# Patient Record
Sex: Male | Born: 1995 | Race: Black or African American | Hispanic: No | Marital: Single | Smoking: Never smoker
Health system: Southern US, Community
[De-identification: ages and names within clinical notes are randomized; demographics above are authoritative.]

## PROBLEM LIST (undated history)

## (undated) DIAGNOSIS — S92309A Fracture of unspecified metatarsal bone(s), unspecified foot, initial encounter for closed fracture: Secondary | ICD-10-CM

## (undated) DIAGNOSIS — S50312A Abrasion of left elbow, initial encounter: Secondary | ICD-10-CM

## (undated) HISTORY — PX: NO PAST SURGERIES: SHX2092

---

## 2016-09-25 ENCOUNTER — Encounter (HOSPITAL_COMMUNITY): Payer: Self-pay

## 2016-09-25 DIAGNOSIS — R1031 Right lower quadrant pain: Secondary | ICD-10-CM | POA: Diagnosis not present

## 2016-09-25 LAB — CBC
HEMATOCRIT: 40.2 % (ref 39.0–52.0)
Hemoglobin: 13.6 g/dL (ref 13.0–17.0)
MCH: 26.7 pg (ref 26.0–34.0)
MCHC: 33.8 g/dL (ref 30.0–36.0)
MCV: 78.8 fL (ref 78.0–100.0)
PLATELETS: 197 10*3/uL (ref 150–400)
RBC: 5.1 MIL/uL (ref 4.22–5.81)
RDW: 12.7 % (ref 11.5–15.5)
WBC: 6.8 10*3/uL (ref 4.0–10.5)

## 2016-09-25 LAB — URINALYSIS, ROUTINE W REFLEX MICROSCOPIC
BILIRUBIN URINE: NEGATIVE
GLUCOSE, UA: NEGATIVE mg/dL
Hgb urine dipstick: NEGATIVE
KETONES UR: NEGATIVE mg/dL
Leukocytes, UA: NEGATIVE
Nitrite: NEGATIVE
PROTEIN: NEGATIVE mg/dL
Specific Gravity, Urine: 1.025 (ref 1.005–1.030)
pH: 7 (ref 5.0–8.0)

## 2016-09-25 NOTE — ED Triage Notes (Signed)
PT C/O RUQ PAIN WITH NAUSEA SINCE THIS AFTERNOON. PT STS AFTER WRESTLING PRACTICE, THE PAIN GOT WORSE. DENIES N/V/D, OR FEVER.

## 2016-09-26 ENCOUNTER — Emergency Department (HOSPITAL_COMMUNITY)
Admission: EM | Admit: 2016-09-26 | Discharge: 2016-09-26 | Disposition: A | Discharge status: Home / Self Care | Payer: Managed Care, Other (non HMO) | Attending: Emergency Medicine | Admitting: Emergency Medicine

## 2016-09-26 ENCOUNTER — Encounter (HOSPITAL_COMMUNITY): Payer: Self-pay

## 2016-09-26 ENCOUNTER — Emergency Department (HOSPITAL_COMMUNITY): Payer: Managed Care, Other (non HMO)

## 2016-09-26 DIAGNOSIS — R1031 Right lower quadrant pain: Secondary | ICD-10-CM

## 2016-09-26 LAB — COMPREHENSIVE METABOLIC PANEL
ALT: 30 U/L (ref 17–63)
ANION GAP: 6 (ref 5–15)
AST: 32 U/L (ref 15–41)
Albumin: 4.6 g/dL (ref 3.5–5.0)
Alkaline Phosphatase: 58 U/L (ref 38–126)
BUN: 12 mg/dL (ref 6–20)
CHLORIDE: 106 mmol/L (ref 101–111)
CO2: 27 mmol/L (ref 22–32)
Calcium: 9.2 mg/dL (ref 8.9–10.3)
Creatinine, Ser: 1.03 mg/dL (ref 0.61–1.24)
GFR calc non Af Amer: >60 mL/min (ref >60)
GLUCOSE: 87 mg/dL (ref 65–99)
POTASSIUM: 4 mmol/L (ref 3.5–5.1)
SODIUM: 139 mmol/L (ref 135–145)
Total Bilirubin: 0.8 mg/dL (ref 0.3–1.2)
Total Protein: 7 g/dL (ref 6.5–8.1)

## 2016-09-26 LAB — LIPASE, BLOOD: LIPASE: 31 U/L (ref 11–51)

## 2016-09-26 MED ORDER — MORPHINE SULFATE (PF) 4 MG/ML IV SOLN
4.0000 mg | Freq: Once | INTRAVENOUS | Status: AC
  Administered 2016-09-26: 4 mg via INTRAVENOUS
  Filled 2016-09-26: qty 1

## 2016-09-26 MED ORDER — IOPAMIDOL (ISOVUE-300) INJECTION 61%
INTRAVENOUS | Status: AC
  Administered 2016-09-26: 100 mL via INTRAVENOUS
  Filled 2016-09-26: qty 100

## 2016-09-26 MED ORDER — SODIUM CHLORIDE 0.9 % IV BOLUS (SEPSIS)
1000.0000 mL | Freq: Once | INTRAVENOUS | Status: AC
  Administered 2016-09-26: 1000 mL via INTRAVENOUS

## 2016-09-26 MED ORDER — ONDANSETRON HCL 4 MG/2ML IJ SOLN
4.0000 mg | Freq: Once | INTRAMUSCULAR | Status: AC
  Administered 2016-09-26: 4 mg via INTRAVENOUS
  Filled 2016-09-26: qty 2

## 2016-09-26 MED ORDER — IOPAMIDOL (ISOVUE-300) INJECTION 61%
100.0000 mL | Freq: Once | INTRAVENOUS | Status: AC | PRN
  Administered 2016-09-26: 100 mL via INTRAVENOUS

## 2016-09-26 NOTE — Discharge Instructions (Signed)
You were seen to day for abdominal pain. Your workup is reassuring and your CT scan is negative. If you have new or worsening symptoms you need to be reevaluated.

## 2016-09-26 NOTE — ED Provider Notes (Signed)
[facility]-EMERGENCY DEPT Provider Note   CSN: [id number] Arrival date & time: 09/25/16  2249   By signing my name below, I, Nelwyn Salisbury, attest that this documentation has been prepared under the direction and in the presence of Shon Baton, MD . Electronically Signed: Nelwyn Salisbury, Scribe. 09/26/2016. 2:23 AM.  History   Chief Complaint Chief Complaint  Patient presents with  . Abdominal Pain    RUQ   The history is provided by the patient. No language interpreter was used.    HPI Comments:  Jacob English is a 21 y.o. male who presents to the Emergency Department complaining of sudden-onset, constant, worsened RLQ abdominal pain beginning earlier today. He describes his symptoms as a 7/10 pain that originally felt like a stomach ache, but has moved to his right side. Pt notes that the pain seemed to worsen after his wrestling practice today. He denies any vomiting, diarrhea, fevers or back pain.  He has not taken anything for his symptoms.  History reviewed. No pertinent past medical history.  There are no active problems to display for this patient.   History reviewed. No pertinent surgical history.     Home Medications    Prior to Admission medications   Not on File    Family History History reviewed. No pertinent family history.  Social History Social History  Substance Use Topics  . Smoking status: Never Smoker  . Smokeless tobacco: Never Used  . Alcohol use No     Allergies   Patient has no known allergies.   Review of Systems Review of Systems  Constitutional: Negative for fever.  Respiratory: Negative for shortness of breath.   Cardiovascular: Negative for chest pain.  Gastrointestinal: Positive for abdominal pain. Negative for diarrhea and vomiting.  Musculoskeletal: Negative for back pain.  All other systems reviewed and are negative.    Physical Exam Updated Vital Signs BP 123/71 (BP Location: Left Arm)   Pulse (!) 50   Temp 98.2 F  (36.8 C) (Oral)   Resp 16   Ht 5\' 5"  (1.651 m)   Wt 170 lb (77.1 kg)   SpO2 99%   BMI 28.29 kg/m   Physical Exam  Constitutional: He is oriented to person, place, and time. He appears well-developed and well-nourished.  HENT:  Head: Normocephalic and atraumatic.  Cardiovascular: Normal rate, regular rhythm and normal heart sounds.   No murmur heard. Pulmonary/Chest: Effort normal and breath sounds normal. No respiratory distress. He has no wheezes.  Abdominal: Soft. Bowel sounds are normal. There is tenderness. There is no rebound.  Right lower quadrant tenderness to palpation, positive Rovsing's  Musculoskeletal: He exhibits no edema.  Neurological: He is alert and oriented to person, place, and time.  Skin: Skin is warm and dry.  Psychiatric: He has a normal mood and affect.  Nursing note and vitals reviewed.    ED Treatments / Results  DIAGNOSTIC STUDIES:  Oxygen Saturation is 97% on RA, normal by my interpretation.    COORDINATION OF CARE:  2:44 AM Discussed treatment plan with pt at bedside which includes nausea medication, CT scan, and pain medication and pt agreed to plan.  Labs (all labs ordered are listed, but only abnormal results are displayed) Labs Reviewed  URINALYSIS, ROUTINE W REFLEX MICROSCOPIC - Abnormal; Notable for the following:       Result Value   APPearance CLOUDY (*)    All other components within normal limits  LIPASE, BLOOD  COMPREHENSIVE METABOLIC PANEL  CBC  EKG  EKG Interpretation None       Radiology Ct Abdomen Pelvis W Contrast  Result Date: 09/26/2016 CLINICAL DATA:  Acute onset of right lower quadrant abdominal pain. Initial encounter. EXAM: CT ABDOMEN AND PELVIS WITH CONTRAST TECHNIQUE: Multidetector CT imaging of the abdomen and pelvis was performed using the standard protocol following bolus administration of intravenous contrast. CONTRAST:  100 mL of Isovue 300 IV contrast COMPARISON:  None. FINDINGS: Lower chest: The  visualized lung bases are grossly clear. The visualized portions of the mediastinum are unremarkable. Hepatobiliary: The liver is unremarkable in appearance. The gallbladder is unremarkable in appearance. The common bile duct remains normal in caliber. Pancreas: The pancreas is within normal limits. Spleen: The spleen is unremarkable in appearance. Adrenals/Urinary Tract: The adrenal glands are unremarkable in appearance. The kidneys are within normal limits. There is no evidence of hydronephrosis. No renal or ureteral stones are identified. No perinephric stranding is seen. Stomach/Bowel: The stomach is unremarkable in appearance. The small bowel is within normal limits. The appendix is normal in caliber, without evidence of appendicitis. The colon is unremarkable in appearance. Vascular/Lymphatic: The abdominal aorta is unremarkable in appearance. The inferior vena cava is grossly unremarkable. No retroperitoneal lymphadenopathy is seen. No pelvic sidewall lymphadenopathy is identified. Reproductive: The bladder is mildly distended and grossly unremarkable. The prostate remains normal in size. Other: No additional soft tissue abnormalities are seen. Musculoskeletal: No acute osseous abnormalities are identified. The visualized musculature is unremarkable in appearance. IMPRESSION: Unremarkable contrast-enhanced CT of the abdomen and pelvis. Electronically Signed   By: Roanna RaiderJeffery  Chang M.D.   On: 09/26/2016 04:11    Procedures Procedures (including critical care time)  Medications Ordered in ED Medications  sodium chloride 0.9 % bolus 1,000 mL (1,000 mLs Intravenous New Bag/Given 09/26/16 0320)  morphine 4 MG/ML injection 4 mg (4 mg Intravenous Given 09/26/16 0320)  ondansetron (ZOFRAN) injection 4 mg (4 mg Intravenous Given 09/26/16 0320)  iopamidol (ISOVUE-300) 61 % injection 100 mL (100 mLs Intravenous Contrast Given 09/26/16 0347)     Initial Impression / Assessment and Plan / ED Course  I have reviewed  the triage vital signs and the nursing notes.  Pertinent labs & imaging results that were available during my care of the patient were reviewed by me and considered in my medical decision making (see chart for details).  Clinical Course     Patient presents with right mid and right lower quadrant abdominal pain. Ongoing for the last day. Initially. Umbilical but has been to the right lower quadrant. He is nontoxic. Afebrile. He is fairly tender on exam without signs of peritonitis. Vital signs reassuring. Lab work is largely reassuring. No evidence of leukocytosis. However, given clinical exam, appendicitis is certainly a consideration. Patient was given pain medication and nausea medication. CT scan obtained and largely reassuring. Patient improved with supportive measures.  After history, exam, and medical workup I feel the patient has been appropriately medically screened and is safe for discharge home. Pertinent diagnoses were discussed with the patient. Patient was given return precautions.   Final Clinical Impressions(s) / ED Diagnoses   Final diagnoses:  Right lower quadrant abdominal pain    New Prescriptions New Prescriptions   No medications on file   I personally performed the services described in this documentation, which was scribed in my presence. The recorded information has been reviewed and is accurate.     Shon Batonourtney F Jenaveve Fenstermaker, MD 09/26/16 [phone]

## 2017-04-18 DIAGNOSIS — S92309A Fracture of unspecified metatarsal bone(s), unspecified foot, initial encounter for closed fracture: Secondary | ICD-10-CM

## 2017-04-18 HISTORY — DX: Fracture of unspecified metatarsal bone(s), unspecified foot, initial encounter for closed fracture: S92.309A

## 2017-05-07 ENCOUNTER — Ambulatory Visit
Admission: RE | Admit: 2017-05-07 | Discharge: 2017-05-07 | Disposition: A | Discharge status: Home / Self Care | Payer: Managed Care, Other (non HMO) | Source: Ambulatory Visit | Attending: Sports Medicine | Admitting: Sports Medicine

## 2017-05-07 ENCOUNTER — Encounter (HOSPITAL_BASED_OUTPATIENT_CLINIC_OR_DEPARTMENT_OTHER): Payer: Self-pay | Admitting: *Deleted

## 2017-05-07 ENCOUNTER — Other Ambulatory Visit: Payer: Self-pay | Admitting: Sports Medicine

## 2017-05-07 DIAGNOSIS — M79672 Pain in left foot: Secondary | ICD-10-CM

## 2017-05-07 DIAGNOSIS — S50312A Abrasion of left elbow, initial encounter: Secondary | ICD-10-CM

## 2017-05-07 HISTORY — DX: Abrasion of left elbow, initial encounter: S50.312A

## 2017-05-08 ENCOUNTER — Encounter (HOSPITAL_BASED_OUTPATIENT_CLINIC_OR_DEPARTMENT_OTHER)
Admission: RE | Disposition: A | Discharge status: Home / Self Care | Payer: Self-pay | Source: Ambulatory Visit | Attending: Orthopedic Surgery

## 2017-05-08 ENCOUNTER — Ambulatory Visit (HOSPITAL_BASED_OUTPATIENT_CLINIC_OR_DEPARTMENT_OTHER): Payer: Managed Care, Other (non HMO) | Admitting: Anesthesiology

## 2017-05-08 ENCOUNTER — Encounter (HOSPITAL_BASED_OUTPATIENT_CLINIC_OR_DEPARTMENT_OTHER): Payer: Self-pay | Admitting: *Deleted

## 2017-05-08 ENCOUNTER — Ambulatory Visit (HOSPITAL_BASED_OUTPATIENT_CLINIC_OR_DEPARTMENT_OTHER)
Admission: RE | Admit: 2017-05-08 | Discharge: 2017-05-08 | Disposition: A | Discharge status: Home / Self Care | Payer: Managed Care, Other (non HMO) | Source: Ambulatory Visit | Attending: Orthopedic Surgery | Admitting: Orthopedic Surgery

## 2017-05-08 DIAGNOSIS — Y92321 Football field as the place of occurrence of the external cause: Secondary | ICD-10-CM | POA: Insufficient documentation

## 2017-05-08 DIAGNOSIS — S92342A Displaced fracture of fourth metatarsal bone, left foot, initial encounter for closed fracture: Secondary | ICD-10-CM | POA: Insufficient documentation

## 2017-05-08 DIAGNOSIS — S92352A Displaced fracture of fifth metatarsal bone, left foot, initial encounter for closed fracture: Secondary | ICD-10-CM | POA: Diagnosis not present

## 2017-05-08 DIAGNOSIS — Y9361 Activity, american tackle football: Secondary | ICD-10-CM | POA: Insufficient documentation

## 2017-05-08 DIAGNOSIS — S93315A Dislocation of tarsal joint of left foot, initial encounter: Secondary | ICD-10-CM

## 2017-05-08 HISTORY — DX: Fracture of unspecified metatarsal bone(s), unspecified foot, initial encounter for closed fracture: S92.309A

## 2017-05-08 HISTORY — DX: Abrasion of left elbow, initial encounter: S50.312A

## 2017-05-08 HISTORY — PX: CLOSED REDUCTION METACARPAL WITH PERCUTANEOUS PINNING: SHX5613

## 2017-05-08 SURGERY — CLOSED REDUCTION, FRACTURE, METACARPAL BONE, WITH PERCUTANEOUS PINNING
Anesthesia: General | Site: Fourth Toe | Laterality: Left

## 2017-05-08 MED ORDER — CEFAZOLIN SODIUM-DEXTROSE 2-4 GM/100ML-% IV SOLN
2.0000 g | INTRAVENOUS | Status: AC
  Administered 2017-05-08: 2 g via INTRAVENOUS

## 2017-05-08 MED ORDER — MEPERIDINE HCL 25 MG/ML IJ SOLN: 6.2500 mg | INTRAMUSCULAR | Status: DC | PRN

## 2017-05-08 MED ORDER — ACETAMINOPHEN 500 MG PO TABS
ORAL_TABLET | ORAL | Status: AC
  Filled 2017-05-08: qty 2

## 2017-05-08 MED ORDER — LIDOCAINE 2% (20 MG/ML) 5 ML SYRINGE
INTRAMUSCULAR | Status: AC
  Filled 2017-05-08: qty 5

## 2017-05-08 MED ORDER — ACETAMINOPHEN 160 MG/5ML PO SOLN: 325.0000 mg | ORAL | Status: DC | PRN

## 2017-05-08 MED ORDER — OXYCODONE HCL 5 MG/5ML PO SOLN: 5.0000 mg | Freq: Once | ORAL | Status: DC | PRN

## 2017-05-08 MED ORDER — MIDAZOLAM HCL 2 MG/2ML IJ SOLN
INTRAMUSCULAR | Status: AC
  Filled 2017-05-08: qty 2

## 2017-05-08 MED ORDER — LACTATED RINGERS IV SOLN
INTRAVENOUS | Status: DC
  Administered 2017-05-08: 10 mL/h via INTRAVENOUS

## 2017-05-08 MED ORDER — PROPOFOL 500 MG/50ML IV EMUL
INTRAVENOUS | Status: AC
  Filled 2017-05-08: qty 50

## 2017-05-08 MED ORDER — MIDAZOLAM HCL 2 MG/2ML IJ SOLN
1.0000 mg | INTRAMUSCULAR | Status: DC | PRN
  Administered 2017-05-08: 2 mg via INTRAVENOUS

## 2017-05-08 MED ORDER — OXYCODONE HCL 5 MG PO TABS: 5.0000 mg | ORAL_TABLET | Freq: Once | ORAL | Status: DC | PRN

## 2017-05-08 MED ORDER — CEFAZOLIN SODIUM-DEXTROSE 2-4 GM/100ML-% IV SOLN
INTRAVENOUS | Status: AC
  Filled 2017-05-08: qty 100

## 2017-05-08 MED ORDER — DEXAMETHASONE SODIUM PHOSPHATE 10 MG/ML IJ SOLN
INTRAMUSCULAR | Status: AC
  Filled 2017-05-08: qty 1

## 2017-05-08 MED ORDER — LACTATED RINGERS IV SOLN
INTRAVENOUS | Status: DC
  Administered 2017-05-08: 08:00:00 via INTRAVENOUS

## 2017-05-08 MED ORDER — ACETAMINOPHEN 500 MG PO TABS
1000.0000 mg | ORAL_TABLET | Freq: Once | ORAL | Status: AC
  Administered 2017-05-08: 1000 mg via ORAL

## 2017-05-08 MED ORDER — GABAPENTIN 300 MG PO CAPS
300.0000 mg | ORAL_CAPSULE | Freq: Once | ORAL | Status: AC
  Administered 2017-05-08: 300 mg via ORAL

## 2017-05-08 MED ORDER — DEXAMETHASONE SODIUM PHOSPHATE 4 MG/ML IJ SOLN
INTRAMUSCULAR | Status: DC | PRN
  Administered 2017-05-08: 10 mg via INTRAVENOUS

## 2017-05-08 MED ORDER — FENTANYL CITRATE (PF) 100 MCG/2ML IJ SOLN
50.0000 ug | INTRAMUSCULAR | Status: DC | PRN
  Administered 2017-05-08: 50 ug via INTRAVENOUS
  Administered 2017-05-08: 100 ug via INTRAVENOUS

## 2017-05-08 MED ORDER — PROPOFOL 10 MG/ML IV BOLUS
INTRAVENOUS | Status: DC | PRN
  Administered 2017-05-08: 200 mg via INTRAVENOUS

## 2017-05-08 MED ORDER — KETOROLAC TROMETHAMINE 30 MG/ML IJ SOLN: 30.0000 mg | Freq: Once | INTRAMUSCULAR | Status: DC | PRN

## 2017-05-08 MED ORDER — ROPIVACAINE HCL 5 MG/ML IJ SOLN
INTRAMUSCULAR | Status: DC | PRN
  Administered 2017-05-08: 5 mL via PERINEURAL
  Administered 2017-05-08 (×2): 10 mL via PERINEURAL
  Administered 2017-05-08: 5 mL via PERINEURAL

## 2017-05-08 MED ORDER — SCOPOLAMINE 1 MG/3DAYS TD PT72: 1.0000 | MEDICATED_PATCH | Freq: Once | TRANSDERMAL | Status: DC | PRN

## 2017-05-08 MED ORDER — LIDOCAINE 2% (20 MG/ML) 5 ML SYRINGE
INTRAMUSCULAR | Status: DC | PRN
  Administered 2017-05-08: 60 mg via INTRAVENOUS

## 2017-05-08 MED ORDER — GABAPENTIN 300 MG PO CAPS
ORAL_CAPSULE | ORAL | Status: AC
  Filled 2017-05-08: qty 1

## 2017-05-08 MED ORDER — CHLORHEXIDINE GLUCONATE 4 % EX LIQD: 60.0000 mL | Freq: Once | CUTANEOUS | Status: DC

## 2017-05-08 MED ORDER — ONDANSETRON HCL 4 MG/2ML IJ SOLN: 4.0000 mg | Freq: Once | INTRAMUSCULAR | Status: DC | PRN

## 2017-05-08 MED ORDER — ACETAMINOPHEN 325 MG PO TABS: 325.0000 mg | ORAL_TABLET | ORAL | Status: DC | PRN

## 2017-05-08 MED ORDER — FENTANYL CITRATE (PF) 100 MCG/2ML IJ SOLN
INTRAMUSCULAR | Status: AC
  Filled 2017-05-08: qty 2

## 2017-05-08 MED ORDER — FENTANYL CITRATE (PF) 100 MCG/2ML IJ SOLN: 25.0000 ug | INTRAMUSCULAR | Status: DC | PRN

## 2017-05-08 MED ORDER — ONDANSETRON HCL 4 MG/2ML IJ SOLN
INTRAMUSCULAR | Status: AC
  Filled 2017-05-08: qty 2

## 2017-05-08 SURGICAL SUPPLY — 69 items
BANDAGE ACE 3X5.8 VEL STRL LF (GAUZE/BANDAGES/DRESSINGS) IMPLANT
BANDAGE ACE 4X5 VEL STRL LF (GAUZE/BANDAGES/DRESSINGS) ×5 IMPLANT
BANDAGE ESMARK 6X9 LF (GAUZE/BANDAGES/DRESSINGS) IMPLANT
BLADE SURG 15 STRL LF DISP TIS (BLADE) ×3 IMPLANT
BLADE SURG 15 STRL SS (BLADE) ×2
BNDG COHESIVE 4X5 TAN STRL (GAUZE/BANDAGES/DRESSINGS) ×5 IMPLANT
BNDG ESMARK 4X9 LF (GAUZE/BANDAGES/DRESSINGS) IMPLANT
BNDG ESMARK 6X9 LF (GAUZE/BANDAGES/DRESSINGS)
CHLORAPREP W/TINT 26ML (MISCELLANEOUS) ×5 IMPLANT
CLOSURE STERI-STRIP 1/2X4 (GAUZE/BANDAGES/DRESSINGS)
CLSR STERI-STRIP ANTIMIC 1/2X4 (GAUZE/BANDAGES/DRESSINGS) IMPLANT
COVER BACK TABLE 60X90IN (DRAPES) ×5 IMPLANT
COVER MAYO STAND STRL (DRAPES) IMPLANT
CUFF TOURNIQUET SINGLE 24IN (TOURNIQUET CUFF) ×5 IMPLANT
CUFF TOURNIQUET SINGLE 34IN LL (TOURNIQUET CUFF) IMPLANT
DECANTER SPIKE VIAL GLASS SM (MISCELLANEOUS) IMPLANT
DRAPE EXTREMITY T 121X128X90 (DRAPE) ×5 IMPLANT
DRAPE IMP U-DRAPE 54X76 (DRAPES) ×5 IMPLANT
DRAPE OEC MINIVIEW 54X84 (DRAPES) ×5 IMPLANT
DRAPE SURG 17X23 STRL (DRAPES) IMPLANT
DRAPE U-SHAPE 47X51 STRL (DRAPES) ×5 IMPLANT
DRSG EMULSION OIL 3X3 NADH (GAUZE/BANDAGES/DRESSINGS) ×5 IMPLANT
ELECT REM PT RETURN 9FT ADLT (ELECTROSURGICAL) ×5
ELECTRODE REM PT RTRN 9FT ADLT (ELECTROSURGICAL) ×3 IMPLANT
GAUZE SPONGE 4X4 12PLY STRL (GAUZE/BANDAGES/DRESSINGS) ×5 IMPLANT
GAUZE XEROFORM 1X8 LF (GAUZE/BANDAGES/DRESSINGS) ×5 IMPLANT
GLOVE BIO SURGEON STRL SZ7.5 (GLOVE) ×10 IMPLANT
GLOVE BIOGEL PI IND STRL 7.0 (GLOVE) ×6 IMPLANT
GLOVE BIOGEL PI IND STRL 8 (GLOVE) ×6 IMPLANT
GLOVE BIOGEL PI INDICATOR 7.0 (GLOVE) ×4
GLOVE BIOGEL PI INDICATOR 8 (GLOVE) ×4
GLOVE ECLIPSE 6.5 STRL STRAW (GLOVE) ×5 IMPLANT
GOWN STRL REUS W/ TWL LRG LVL3 (GOWN DISPOSABLE) ×6 IMPLANT
GOWN STRL REUS W/ TWL XL LVL3 (GOWN DISPOSABLE) ×3 IMPLANT
GOWN STRL REUS W/TWL LRG LVL3 (GOWN DISPOSABLE) ×4
GOWN STRL REUS W/TWL XL LVL3 (GOWN DISPOSABLE) ×2
NEEDLE HYPO 25X1 1.5 SAFETY (NEEDLE) IMPLANT
NS IRRIG 1000ML POUR BTL (IV SOLUTION) ×5 IMPLANT
PACK BASIN DAY SURGERY FS (CUSTOM PROCEDURE TRAY) ×5 IMPLANT
PAD CAST 3X4 CTTN HI CHSV (CAST SUPPLIES) IMPLANT
PAD CAST 4YDX4 CTTN HI CHSV (CAST SUPPLIES) ×3 IMPLANT
PADDING CAST ABS 4INX4YD NS (CAST SUPPLIES) ×8
PADDING CAST ABS COTTON 4X4 ST (CAST SUPPLIES) ×12 IMPLANT
PADDING CAST COTTON 3X4 STRL (CAST SUPPLIES)
PADDING CAST COTTON 4X4 STRL (CAST SUPPLIES) ×2
PENCIL BUTTON HOLSTER BLD 10FT (ELECTRODE) IMPLANT
PIN STEINMAN 5/64X9 TRO PT NS (PIN) ×5 IMPLANT
SLEEVE SCD COMPRESS KNEE MED (MISCELLANEOUS) ×5 IMPLANT
SPLINT FAST PLASTER 5X30 (CAST SUPPLIES) ×40
SPLINT PLASTER CAST FAST 5X30 (CAST SUPPLIES) ×60 IMPLANT
SPONGE LAP 4X18 X RAY DECT (DISPOSABLE) ×5 IMPLANT
STOCKINETTE 6  STRL (DRAPES) ×2
STOCKINETTE 6 STRL (DRAPES) ×3 IMPLANT
SUCTION FRAZIER HANDLE 10FR (MISCELLANEOUS)
SUCTION TUBE FRAZIER 10FR DISP (MISCELLANEOUS) IMPLANT
SUT ETHILON 3 0 PS 1 (SUTURE) IMPLANT
SUT MON AB 2-0 CT1 36 (SUTURE) IMPLANT
SUT MON AB 4-0 PC3 18 (SUTURE) IMPLANT
SUT PROLENE 3 0 PS 2 (SUTURE) IMPLANT
SUT VIC AB 2-0 SH 27 (SUTURE)
SUT VIC AB 2-0 SH 27XBRD (SUTURE) IMPLANT
SUT VIC AB 3-0 FS2 27 (SUTURE) IMPLANT
SYR 20CC LL (SYRINGE) IMPLANT
SYR BULB 3OZ (MISCELLANEOUS) IMPLANT
TOWEL OR 17X24 6PK STRL BLUE (TOWEL DISPOSABLE) ×5 IMPLANT
TOWEL OR NON WOVEN STRL DISP B (DISPOSABLE) IMPLANT
TUBE CONNECTING 20'X1/4 (TUBING)
TUBE CONNECTING 20X1/4 (TUBING) IMPLANT
UNDERPAD 30X30 (UNDERPADS AND DIAPERS) ×5 IMPLANT

## 2017-05-08 NOTE — Progress Notes (Signed)
Assisted Dr. Hatchett with left, ultrasound guided, popliteal block. Side rails up, monitors on throughout procedure. See vital signs in flow sheet. Tolerated Procedure well. 

## 2017-05-08 NOTE — Interval H&P Note (Signed)
History and Physical Interval Note:  05/08/2017 8:07 AM  Jacob English  has presented today for surgery, with the diagnosis of left fourth and fifth metatarsal fractures  The various methods of treatment have been discussed with the patient and family. After consideration of risks, benefits and other options for treatment, the patient has consented to  Procedure(s): EXTERNAL FIXATION OF LEFT FOURTH AND FIFTH TARSAL METATARSAL (Left) OPEN REDUCTION INTERNAL FIXATION (ORIF) METATARSAL (TOE) FRACTURE VS CLOSED REDUCTION (Left) as a surgical intervention .  The patient's history has been reviewed, patient examined, no change in status, stable for surgery.  I have reviewed the patient's chart and labs.  Questions were answered to the patient's satisfaction.     MURPHY, TIMOTHY D

## 2017-05-08 NOTE — Op Note (Signed)
05/08/2017  10:41 AM  PATIENT:  Jacob English    PRE-OPERATIVE DIAGNOSIS:  left fourth and fifth metatarsal fractures  POST-OPERATIVE DIAGNOSIS:  Same  PROCEDURE:  Closed Reduction of Left 4th and 5th Metatarsal with pinning  SURGEON:  Dannell Raczkowski, Jewel Baize, MD  ASSISTANT: Aquilla Hacker, PA-C.  ANESTHESIA:   gen  PREOPERATIVE INDICATIONS:  Jacob English is a  21 y.o. male with a diagnosis of left fourth and fifth metatarsal fractures who failed conservative measures and elected for surgical management.    The risks benefits and alternatives were discussed with the patient preoperatively including but not limited to the risks of infection, bleeding, nerve injury, cardiopulmonary complications, the need for revision surgery, among others, and the patient was willing to proceed.  OPERATIVE IMPLANTS: K-wire  OPERATIVE FINDINGS: stable post reduction  BLOOD LOSS: min  COMPLICATIONS: none  TOURNIQUET TIME: none  OPERATIVE PROCEDURE:  Patient was identified in the preoperative holding area and site was marked by me He was transported to the operating theater and placed on the table in supine position taking care to pad all bony prominences. After a preincinduction time out anesthesia was induced. The left lower extremity was prepped and draped in normal sterile fashion and a pre-incision timeout was performed. He received ancef for preoperative antibiotics.   I performed a closed manipulation of his fractures and dislocation there is a satisfying reduction of his TMT dislocations.  I then took multiple x-rays this is an effective reduction of his fourth and fifth TMT joints  This was also an effective reduction of the small avulsion fractures of his cuboid fourth and fifth metatarsals.  I stressed his TMT joints they were stable  I then placed a Steinmann pin across his fifth TMT joint.  I placed a sterile dressing and a short-leg splint splint he was awoken and taken the PACU in  stable condition  POST OPERATIVE PLAN: Nonweightbearing elevate mobilize for DVT prophylaxis

## 2017-05-08 NOTE — Anesthesia Procedure Notes (Addendum)
Anesthesia Regional Block: Popliteal block   Pre-Anesthetic Checklist: ,, timeout performed, Correct Patient, Correct Site, Correct Laterality, Correct Procedure, Correct Position, site marked, Risks and benefits discussed,  Surgical consent,  Pre-op evaluation,  At surgeon's request and post-op pain management  Laterality: Lower and Left  Prep: chloraprep       Needles:  Injection technique: Single-shot  Needle Type: Echogenic Stimulator Needle     Needle Length: 9cm  Needle Gauge: 21   Needle insertion depth: 2.5 cm   Additional Needles:   Procedures: ultrasound guided,,,,,,,,  Narrative:  Start time: 05/08/2017 8:15 AM End time: 05/08/2017 8:30 AM Injection made incrementally with aspirations every 5 mL. Anesthesiologist: Leilani Able

## 2017-05-08 NOTE — Discharge Instructions (Signed)
Elevate leg - Toes above nose as much as possible to reduce pain / swelling. ° °You may loosen and re-apply ace wrap if it feels too tight. ° °Weight Bearing:  Non weight bearing affected leg. ° °Diet: As you were doing prior to hospitalization  ° °Shower:  You have a splint on, leave the splint in place and keep the splint dry with a plastic bag. ° °Dressing:  You have a splint. Leave the splint in place and we will change your bandages during your first follow-up appointment.   ° °Activity:  Increase activity slowly as tolerated, but follow the weight bearing instructions below.  The rules on driving is that you can not be taking narcotics while you drive, and you must feel in control of the vehicle.   ° °To prevent constipation:  °Narcotic medicines cause constipation.  Wean these as soon as is appropriate.   °You may use a stool softener such as - ° °Colace (over the counter) 100 mg by mouth twice a day  °Drink plenty of fluids (prune juice may be helpful) and high fiber foods °Miralax (over the counter) for constipation as needed.   ° °Itching:  If you experience itching with your medications, try taking only a single pain pill, or even half a pain pill at a time.  You may take up to 10 pain pills per day, and you can also use benadryl over the counter for itching or also to help with sleep.  ° °Precautions:  If you experience chest pain or shortness of breath - call 911 immediately for transfer to the hospital emergency department!! ° °If you develop a fever greater that 101 F, purulent drainage from wound, increased redness or drainage from wound, or calf pain -- Call the office at [phone]                             °                    °Follow- Up Appointment:  Please call for an appointment to be seen in 2 weeks [place] - [phone] ° ° ° °Post Anesthesia Home Care Instructions ° °Activity: °Get plenty of rest for the remainder of the day. A responsible individual must stay with you for 24  hours following the procedure.  °For the next 24 hours, DO NOT: °-Drive a car °-Operate machinery °-Drink alcoholic beverages °-Take any medication unless instructed by your physician °-Make any legal decisions or sign important papers. ° °Meals: °Start with liquid foods such as gelatin or soup. Progress to regular foods as tolerated. Avoid greasy, spicy, heavy foods. If nausea and/or vomiting occur, drink only clear liquids until the nausea and/or vomiting subsides. Call your physician if vomiting continues. ° °Special Instructions/Symptoms: °Your throat may feel dry or sore from the anesthesia or the breathing tube placed in your throat during surgery. If this causes discomfort, gargle with warm salt water. The discomfort should disappear within 24 hours. ° °If you had a scopolamine patch placed behind your ear for the management of post- operative nausea and/or vomiting: ° °1. The medication in the patch is effective for 72 hours, after which it should be removed.  Wrap patch in a tissue and discard in the trash. Wash hands thoroughly with soap and water. °2. You may remove the patch earlier than 72 hours if you experience unpleasant side effects which may include dry mouth, dizziness   or visual disturbances. °3. Avoid touching the patch. Wash your hands with soap and water after contact with the patch. °  ° ° °Regional Anesthesia Blocks ° °1. Numbness or the inability to move the "blocked" extremity may last from 3-48 hours after placement. The length of time depends on the medication injected and your individual response to the medication. If the numbness is not going away after 48 hours, call your surgeon. ° °2. The extremity that is blocked will need to be protected until the numbness is gone and the  Strength has returned. Because you cannot feel it, you will need to take extra care to avoid injury. Because it may be weak, you may have difficulty moving it or using it. You may not know what position it is in  without looking at it while the block is in effect. ° °3. For blocks in the legs and feet, returning to weight bearing and walking needs to be done carefully. You will need to wait until the numbness is entirely gone and the strength has returned. You should be able to move your leg and foot normally before you try and bear weight or walk. You will need someone to be with you when you first try to ensure you do not fall and possibly risk injury. ° °4. Bruising and tenderness at the needle site are common side effects and will resolve in a few days. ° °5. Persistent numbness or new problems with movement should be communicated to the surgeon or the [facility] ([phone])/ [facility] ([phone]). °

## 2017-05-08 NOTE — Transfer of Care (Signed)
Immediate Anesthesia Transfer of Care Note  Patient: Jacob English  Procedure(s) Performed: Procedure(s): Closed Reduction of Left 4th and 5th Metatarsal with pinning (Left)  Patient Location: PACU  Anesthesia Type:General  Level of Consciousness: awake and sedated  Airway & Oxygen Therapy: Patient Spontanous Breathing and Patient connected to face mask oxygen  Post-op Assessment: Report given to RN and Post -op Vital signs reviewed and stable  Post vital signs: Reviewed and stable  Last Vitals:  Vitals:   05/08/17 0830 05/08/17 0840  BP: (!) 143/79   Pulse: (!) 48 (!) 47  Resp: 11 10  Temp:    SpO2: 96% 96%    Last Pain:  Vitals:   05/08/17 0815  PainSc: 4       Patients Stated Pain Goal: 1 (05/08/17 0801)  Complications: No apparent anesthesia complications

## 2017-05-08 NOTE — Anesthesia Procedure Notes (Signed)
Procedure Name: LMA Insertion Performed by: Mairlyn Tegtmeyer W Pre-anesthesia Checklist: Patient identified, Emergency Drugs available, Suction available and Patient being monitored Patient Re-evaluated:Patient Re-evaluated prior to induction Oxygen Delivery Method: Circle system utilized Preoxygenation: Pre-oxygenation with 100% oxygen Induction Type: IV induction Ventilation: Mask ventilation without difficulty LMA: LMA inserted LMA Size: 5.0 Number of attempts: 1 Placement Confirmation: positive ETCO2 Tube secured with: Tape Dental Injury: Teeth and Oropharynx as per pre-operative assessment        

## 2017-05-08 NOTE — H&P (Signed)
ORTHOPAEDIC CONSULTATION  REQUESTING PHYSICIAN: Renette Butters, MD  Chief Complaint: L foot injury   HPI: Jacob English is a 21 y.o. male who complains of L foot pain after an injury at [facility] [employer] [facility] football practice  Past Medical History:  Diagnosis Date  . Abrasion of left elbow 05/07/2017  . Metatarsal fracture 04/2017   left 4th and 5th   Past Surgical History:  Procedure Laterality Date  . NO PAST SURGERIES     Social History   Social History  . Marital status: Single    Spouse name: N/A  . Number of children: N/A  . Years of education: N/A   Social History Main Topics  . Smoking status: Never Smoker  . Smokeless tobacco: Never Used  . Alcohol use No  . Drug use: No  . Sexual activity: Not Asked   Other Topics Concern  . None   Social History Narrative  . None   History reviewed. No pertinent family history. No Known Allergies Prior to Admission medications   Medication Sig Start Date End Date Taking? Authorizing Provider  oxyCODONE-acetaminophen (PERCOCET/ROXICET) 5-325 MG tablet Take by mouth every 4 (four) hours as needed for severe pain.   Yes [provider]   Ct Foot Left Wo Contrast  Result Date: 05/07/2017 CLINICAL DATA:  Left foot pain since an injury playing football 2 days ago. Chief while EXAM: CT OF THE LEFT FOOT WITHOUT CONTRAST TECHNIQUE: Multidetector CT imaging of the left foot was performed according to the standard protocol. Multiplanar CT image reconstructions were also generated. COMPARISON:  None. FINDINGS: Bones/Joint/Cartilage There is dislocation of the cuboid medially and in a plantar direction. The dislocated cuboid slightly overrides the plantar aspect of the base of the fourth metatarsal. There are tiny avulsions of bone adjacent to the bases of the fourth and fifth metatarsals. There is what appears to be old avulsion from the posterolateral aspect of the navicular adjacent to the articulation with the calcaneus and cuboid.  The margins are sclerotic. This could represent an accessory ossification center. Dorsal spurring on the talus and at the dorsal midfoot. IMPRESSION: 1. Dislocation of the cuboid with overriding at the base of the fourth metatarsal. 2. Tiny adjacent avulsions as described. Electronically Signed   By: Lorriane Shire M.D.   On: 05/07/2017 15:09    Positive ROS: All other systems have been reviewed and were otherwise negative with the exception of those mentioned in the HPI and as above.  Labs cbc No results for input(s): WBC, HGB, HCT, PLT in the last 72 hours.  Labs inflam No results for input(s): CRP in the last 72 hours.  Invalid input(s): ESR  Labs coag No results for input(s): INR, PTT in the last 72 hours.  Invalid input(s): PT  No results for input(s): NA, K, CL, CO2, GLUCOSE, BUN, CREATININE, CALCIUM in the last 72 hours.  Physical Exam: Vitals:   05/08/17 0750 05/08/17 0800  BP:  (!) 154/96  Pulse:  (!) 54  Resp:  14  Temp: 98.9 F (37.2 C)   SpO2:  100%   General: Alert, no acute distress Cardiovascular: No pedal edema Respiratory: No cyanosis, no use of accessory musculature GI: No organomegaly, abdomen is soft and non-tender Skin: No lesions in the area of chief complaint other than those listed below in MSK exam.  Neurologic: Sensation intact distally save for the below mentioned MSK exam Psychiatric: Patient is competent for consent with normal mood and affect Lymphatic: No axillary  or cervical lymphadenopathy  MUSCULOSKELETAL:  LLE: compartments soft, NVI, swelling and ttp at lateral midfoot Other extremities are atraumatic with painless ROM and NVI.  Assessment: TMT 4/5 dislocations  Plan: Closed vs open reduction and fixation today   Renette Butters, MD Cell [phone]   05/08/2017 8:05 AM

## 2017-05-08 NOTE — Anesthesia Preprocedure Evaluation (Signed)
Anesthesia Evaluation  Patient identified by MRN, date of birth, ID band Patient awake    Reviewed: Allergy & Precautions, NPO status , Patient's Chart, lab work & pertinent test results  Airway Mallampati: II       Dental no notable dental hx. (+) Teeth Intact   Pulmonary neg pulmonary ROS,    Pulmonary exam normal breath sounds clear to auscultation       Cardiovascular negative cardio ROS Normal cardiovascular exam Rhythm:Regular Rate:Normal     Neuro/Psych negative neurological ROS  negative psych ROS   GI/Hepatic negative GI ROS,   Endo/Other  negative endocrine ROS  Renal/GU negative Renal ROS  negative genitourinary   Musculoskeletal   Abdominal Normal abdominal exam  (+)   Peds  Hematology negative hematology ROS (+)   Anesthesia Other Findings   Reproductive/Obstetrics                             Anesthesia Physical Anesthesia Plan  ASA: I  Anesthesia Plan: General   Post-op Pain Management:    Induction: Intravenous  PONV Risk Score and Plan: 2 and Ondansetron, Dexamethasone and Treatment may vary due to age or medical condition  Airway Management Planned: LMA  Additional Equipment:   Intra-op Plan:   Post-operative Plan:   Informed Consent: I have reviewed the patients History and Physical, chart, labs and discussed the procedure including the risks, benefits and alternatives for the proposed anesthesia with the patient or authorized representative who has indicated his/her understanding and acceptance.     Plan Discussed with: CRNA and Surgeon  Anesthesia Plan Comments:         Anesthesia Quick Evaluation

## 2017-05-08 NOTE — Anesthesia Postprocedure Evaluation (Signed)
Anesthesia Post Note  Patient: Jacob English  Procedure(s) Performed: Procedure(s) (LRB): Closed Reduction of Left 4th and 5th Metatarsal with pinning (Left)     Patient location during evaluation: PACU Anesthesia Type: General Level of consciousness: awake Pain management: pain level controlled Vital Signs Assessment: post-procedure vital signs reviewed and stable Respiratory status: spontaneous breathing Cardiovascular status: stable Postop Assessment: no signs of nausea or vomiting Anesthetic complications: no    Last Vitals:  Vitals:   05/08/17 1045 05/08/17 1054  BP: 112/60 122/72  Pulse: (!) 48 (!) 56  Resp: 11 16  Temp:    SpO2: 100% 100%    Last Pain:  Vitals:   05/08/17 1115  PainSc: 0-No pain   Pain Goal: Patients Stated Pain Goal: 1 (05/08/17 0801)               Parks Czajkowski JR,JOHN Susann Givens

## 2017-05-09 ENCOUNTER — Encounter (HOSPITAL_BASED_OUTPATIENT_CLINIC_OR_DEPARTMENT_OTHER): Payer: Self-pay | Admitting: Orthopedic Surgery

## 2018-06-13 NOTE — H&P (Signed)
[facility] [address]   [address] Jacob [place] [address] [phone] A Division of [facility]  RE:  Jacob, English  [id number]   DOB:  [birth year] 05-31-2018  Reason for visit:  Severe right knee injury suffered on 05-25-2018. History of present illness:  He is a Arboriculturist running back.  He was planted and suffered a severe valgus load to his right knee.  He had an MRI done which shows ACL, PCL, and MCL tear.  Some swelling on the lateral side, but it looks like his laterals and posterolateral corner are intact.  His pain has been well controlled.  He has been in a [place] and using crutches.  EXAMINATION: Well-appearing male in no apparent distress.  Obvious large effusion.  Does not have an abnormal dial.  Does not have a sag.  Likely posterolateral corner is intact.  ASSESSMENT & PLAN: Multiligament knee injury.  We are going to set him up for reconstruction of his ACL, PCL, and MCL.  We are going to use BTB autograft for the ACL, hamstring autograft for the PCL, and allograft for his MCL.  He will be nonweightbearing for six weeks afterwards and start some gentle range of motion with therapy.    Jewel Baize.  Eulah Pont, M.D.   Electronically verified by Jewel Baize. Eulah Pont, M.D. TDM:pd D 05-31-2018 T 06-04-2018

## 2018-06-19 ENCOUNTER — Other Ambulatory Visit: Payer: Self-pay

## 2018-06-19 ENCOUNTER — Encounter (HOSPITAL_BASED_OUTPATIENT_CLINIC_OR_DEPARTMENT_OTHER): Payer: Self-pay | Admitting: *Deleted

## 2018-06-19 NOTE — Progress Notes (Signed)
Spoke with [facility] PA at [facility] office , does not need MRSA  Swab before surgery but will order betadine swab day of surgery. He will change orders.

## 2018-06-21 ENCOUNTER — Other Ambulatory Visit: Payer: Self-pay

## 2018-06-21 ENCOUNTER — Ambulatory Visit (HOSPITAL_BASED_OUTPATIENT_CLINIC_OR_DEPARTMENT_OTHER)
Admission: RE | Admit: 2018-06-21 | Discharge: 2018-06-22 | Disposition: A | Discharge status: Home / Self Care | Payer: PRIVATE HEALTH INSURANCE | Source: Ambulatory Visit | Attending: Orthopedic Surgery | Admitting: Orthopedic Surgery

## 2018-06-21 ENCOUNTER — Ambulatory Visit (HOSPITAL_BASED_OUTPATIENT_CLINIC_OR_DEPARTMENT_OTHER): Payer: PRIVATE HEALTH INSURANCE | Admitting: Anesthesiology

## 2018-06-21 ENCOUNTER — Encounter (HOSPITAL_BASED_OUTPATIENT_CLINIC_OR_DEPARTMENT_OTHER): Payer: Self-pay | Admitting: *Deleted

## 2018-06-21 ENCOUNTER — Encounter (HOSPITAL_BASED_OUTPATIENT_CLINIC_OR_DEPARTMENT_OTHER)
Admission: RE | Disposition: A | Discharge status: Home / Self Care | Payer: Self-pay | Source: Ambulatory Visit | Attending: Orthopedic Surgery

## 2018-06-21 DIAGNOSIS — Y9361 Activity, american tackle football: Secondary | ICD-10-CM | POA: Insufficient documentation

## 2018-06-21 DIAGNOSIS — S83511A Sprain of anterior cruciate ligament of right knee, initial encounter: Secondary | ICD-10-CM | POA: Diagnosis present

## 2018-06-21 DIAGNOSIS — S83521A Sprain of posterior cruciate ligament of right knee, initial encounter: Secondary | ICD-10-CM | POA: Diagnosis present

## 2018-06-21 DIAGNOSIS — S83519A Sprain of anterior cruciate ligament of unspecified knee, initial encounter: Secondary | ICD-10-CM | POA: Diagnosis present

## 2018-06-21 DIAGNOSIS — S83411A Sprain of medial collateral ligament of right knee, initial encounter: Secondary | ICD-10-CM | POA: Diagnosis not present

## 2018-06-21 HISTORY — PX: KNEE ARTHROSCOPY WITH MEDIAL COLLATERAL LIGAMENT RECONSTRUCTION: SHX5650

## 2018-06-21 HISTORY — PX: KNEE ARTHROSCOPY WITH ANTERIOR CRUCIATE LIGAMENT (ACL) REPAIR: SHX5644

## 2018-06-21 HISTORY — PX: KNEE ARTHROSCOPY WITH POSTERIOR CRUCIATE LIGAMENT (PCL) RECONSTRUCTION: SHX5657

## 2018-06-21 SURGERY — KNEE ARTHROSCOPY WITH ANTERIOR CRUCIATE LIGAMENT (ACL) REPAIR
Anesthesia: General | Site: Knee | Laterality: Right

## 2018-06-21 MED ORDER — SODIUM CHLORIDE 0.9 % IR SOLN
Status: DC | PRN
  Administered 2018-06-21: 6000 mL

## 2018-06-21 MED ORDER — CEFAZOLIN SODIUM-DEXTROSE 2-4 GM/100ML-% IV SOLN
2.0000 g | INTRAVENOUS | Status: AC
  Administered 2018-06-21 (×2): 2 g via INTRAVENOUS

## 2018-06-21 MED ORDER — LIDOCAINE 2% (20 MG/ML) 5 ML SYRINGE
INTRAMUSCULAR | Status: AC
  Filled 2018-06-21: qty 5

## 2018-06-21 MED ORDER — FENTANYL CITRATE (PF) 100 MCG/2ML IJ SOLN
INTRAMUSCULAR | Status: AC
  Filled 2018-06-21: qty 2

## 2018-06-21 MED ORDER — GABAPENTIN 300 MG PO CAPS
300.0000 mg | ORAL_CAPSULE | Freq: Three times a day (TID) | ORAL | Status: DC
  Administered 2018-06-21: 300 mg via ORAL
  Filled 2018-06-21: qty 1

## 2018-06-21 MED ORDER — ONDANSETRON HCL 4 MG/2ML IJ SOLN
INTRAMUSCULAR | Status: DC | PRN
  Administered 2018-06-21: 4 mg via INTRAVENOUS

## 2018-06-21 MED ORDER — PROPOFOL 10 MG/ML IV BOLUS
INTRAVENOUS | Status: AC
  Filled 2018-06-21: qty 40

## 2018-06-21 MED ORDER — LIDOCAINE HCL (CARDIAC) PF 100 MG/5ML IV SOSY
PREFILLED_SYRINGE | INTRAVENOUS | Status: DC | PRN
  Administered 2018-06-21: 50 mg via INTRAVENOUS

## 2018-06-21 MED ORDER — ACETAMINOPHEN 500 MG PO TABS
1000.0000 mg | ORAL_TABLET | Freq: Once | ORAL | Status: AC
  Administered 2018-06-21: 1000 mg via ORAL

## 2018-06-21 MED ORDER — KETOROLAC TROMETHAMINE 15 MG/ML IJ SOLN
15.0000 mg | Freq: Four times a day (QID) | INTRAMUSCULAR | Status: DC
  Administered 2018-06-21 – 2018-06-22 (×3): 15 mg via INTRAVENOUS
  Filled 2018-06-21 (×2): qty 1

## 2018-06-21 MED ORDER — METOCLOPRAMIDE HCL 5 MG PO TABS: 5.0000 mg | ORAL_TABLET | Freq: Three times a day (TID) | ORAL | Status: DC | PRN

## 2018-06-21 MED ORDER — CHLORHEXIDINE GLUCONATE 4 % EX LIQD: 60.0000 mL | Freq: Once | CUTANEOUS | Status: DC

## 2018-06-21 MED ORDER — ACETAMINOPHEN 325 MG PO TABS: 325.0000 mg | ORAL_TABLET | Freq: Four times a day (QID) | ORAL | Status: DC | PRN

## 2018-06-21 MED ORDER — FENTANYL CITRATE (PF) 100 MCG/2ML IJ SOLN
50.0000 ug | INTRAMUSCULAR | Status: AC | PRN
  Administered 2018-06-21: 50 ug via INTRAVENOUS
  Administered 2018-06-21 (×2): 25 ug via INTRAVENOUS
  Administered 2018-06-21: 100 ug via INTRAVENOUS
  Administered 2018-06-21: 50 ug via INTRAVENOUS

## 2018-06-21 MED ORDER — ONDANSETRON HCL 4 MG/2ML IJ SOLN
INTRAMUSCULAR | Status: AC
  Filled 2018-06-21: qty 2

## 2018-06-21 MED ORDER — DOCUSATE SODIUM 100 MG PO CAPS: 100.0000 mg | ORAL_CAPSULE | Freq: Two times a day (BID) | ORAL | 0 refills | Status: AC

## 2018-06-21 MED ORDER — CEFAZOLIN SODIUM-DEXTROSE 2-4 GM/100ML-% IV SOLN
2.0000 g | Freq: Four times a day (QID) | INTRAVENOUS | Status: AC
  Administered 2018-06-21 – 2018-06-22 (×2): 2 g via INTRAVENOUS
  Filled 2018-06-21 (×2): qty 100

## 2018-06-21 MED ORDER — ASPIRIN EC 81 MG PO TBEC: 81.0000 mg | DELAYED_RELEASE_TABLET | Freq: Every day | ORAL | 0 refills | Status: AC

## 2018-06-21 MED ORDER — DOCUSATE SODIUM 100 MG PO CAPS
100.0000 mg | ORAL_CAPSULE | Freq: Two times a day (BID) | ORAL | Status: DC
  Filled 2018-06-21: qty 1

## 2018-06-21 MED ORDER — IBUPROFEN 800 MG PO TABS: 800.0000 mg | ORAL_TABLET | Freq: Three times a day (TID) | ORAL | 2 refills | Status: AC | PRN

## 2018-06-21 MED ORDER — ONDANSETRON HCL 4 MG PO TABS: 4.0000 mg | ORAL_TABLET | Freq: Three times a day (TID) | ORAL | 0 refills | Status: DC | PRN

## 2018-06-21 MED ORDER — MIDAZOLAM HCL 2 MG/2ML IJ SOLN
1.0000 mg | INTRAMUSCULAR | Status: DC | PRN
  Administered 2018-06-21 (×2): 2 mg via INTRAVENOUS

## 2018-06-21 MED ORDER — OXYCODONE HCL 5 MG PO TABS: 5.0000 mg | ORAL_TABLET | ORAL | 0 refills | Status: AC | PRN

## 2018-06-21 MED ORDER — METOCLOPRAMIDE HCL 5 MG/ML IJ SOLN: 5.0000 mg | Freq: Three times a day (TID) | INTRAMUSCULAR | Status: DC | PRN

## 2018-06-21 MED ORDER — ASPIRIN EC 81 MG PO TBEC: 81.0000 mg | DELAYED_RELEASE_TABLET | Freq: Every day | ORAL | Status: DC

## 2018-06-21 MED ORDER — CEFAZOLIN SODIUM 1 G IJ SOLR
INTRAMUSCULAR | Status: AC
  Filled 2018-06-21: qty 20

## 2018-06-21 MED ORDER — ALBUMIN HUMAN 5 % IV SOLN
INTRAVENOUS | Status: DC | PRN
  Administered 2018-06-21: 15:00:00 via INTRAVENOUS

## 2018-06-21 MED ORDER — LACTATED RINGERS IV SOLN
INTRAVENOUS | Status: DC
  Administered 2018-06-21: 19:00:00 via INTRAVENOUS

## 2018-06-21 MED ORDER — FENTANYL CITRATE (PF) 100 MCG/2ML IJ SOLN
25.0000 ug | INTRAMUSCULAR | Status: DC | PRN
  Administered 2018-06-21 (×2): 50 ug via INTRAVENOUS

## 2018-06-21 MED ORDER — DEXAMETHASONE SODIUM PHOSPHATE 4 MG/ML IJ SOLN
INTRAMUSCULAR | Status: DC | PRN
  Administered 2018-06-21: 10 mg via INTRAVENOUS

## 2018-06-21 MED ORDER — LACTATED RINGERS IV SOLN: INTRAVENOUS | Status: DC

## 2018-06-21 MED ORDER — DIPHENHYDRAMINE HCL 12.5 MG/5ML PO ELIX: 12.5000 mg | ORAL_SOLUTION | ORAL | Status: DC | PRN

## 2018-06-21 MED ORDER — PROPOFOL 10 MG/ML IV BOLUS
INTRAVENOUS | Status: DC | PRN
  Administered 2018-06-21: 200 mg via INTRAVENOUS

## 2018-06-21 MED ORDER — MIDAZOLAM HCL 2 MG/2ML IJ SOLN
INTRAMUSCULAR | Status: AC
  Filled 2018-06-21: qty 2

## 2018-06-21 MED ORDER — SCOPOLAMINE 1 MG/3DAYS TD PT72: 1.0000 | MEDICATED_PATCH | Freq: Once | TRANSDERMAL | Status: DC | PRN

## 2018-06-21 MED ORDER — GABAPENTIN 300 MG PO CAPS
ORAL_CAPSULE | ORAL | Status: AC
  Filled 2018-06-21: qty 1

## 2018-06-21 MED ORDER — KETOROLAC TROMETHAMINE 30 MG/ML IJ SOLN
INTRAMUSCULAR | Status: AC
  Filled 2018-06-21: qty 1

## 2018-06-21 MED ORDER — OXYCODONE HCL 5 MG PO TABS: 5.0000 mg | ORAL_TABLET | Freq: Once | ORAL | Status: DC | PRN

## 2018-06-21 MED ORDER — GABAPENTIN 300 MG PO CAPS: 300.0000 mg | ORAL_CAPSULE | Freq: Three times a day (TID) | ORAL | 0 refills | Status: AC

## 2018-06-21 MED ORDER — DEXAMETHASONE SODIUM PHOSPHATE 10 MG/ML IJ SOLN
INTRAMUSCULAR | Status: AC
  Filled 2018-06-21: qty 1

## 2018-06-21 MED ORDER — ACETAMINOPHEN 500 MG PO TABS: 1000.0000 mg | ORAL_TABLET | Freq: Three times a day (TID) | ORAL | 0 refills | Status: AC

## 2018-06-21 MED ORDER — BUPIVACAINE-EPINEPHRINE (PF) 0.5% -1:200000 IJ SOLN
INTRAMUSCULAR | Status: DC | PRN
  Administered 2018-06-21: 30 mL via PERINEURAL

## 2018-06-21 MED ORDER — ONDANSETRON HCL 4 MG/2ML IJ SOLN: 4.0000 mg | Freq: Four times a day (QID) | INTRAMUSCULAR | Status: DC | PRN

## 2018-06-21 MED ORDER — ONDANSETRON HCL 4 MG/2ML IJ SOLN
4.0000 mg | Freq: Four times a day (QID) | INTRAMUSCULAR | Status: DC | PRN
  Administered 2018-06-22: 4 mg via INTRAVENOUS
  Filled 2018-06-21: qty 2

## 2018-06-21 MED ORDER — METHOCARBAMOL 500 MG PO TABS: 500.0000 mg | ORAL_TABLET | Freq: Four times a day (QID) | ORAL | 0 refills | Status: DC | PRN

## 2018-06-21 MED ORDER — ONDANSETRON HCL 4 MG PO TABS
4.0000 mg | ORAL_TABLET | Freq: Four times a day (QID) | ORAL | Status: DC | PRN
  Administered 2018-06-22: 4 mg via ORAL
  Filled 2018-06-21: qty 1

## 2018-06-21 MED ORDER — ACETAMINOPHEN 500 MG PO TABS
1000.0000 mg | ORAL_TABLET | Freq: Three times a day (TID) | ORAL | Status: DC
  Administered 2018-06-21 – 2018-06-22 (×2): 1000 mg via ORAL
  Filled 2018-06-21 (×2): qty 2

## 2018-06-21 MED ORDER — ACETAMINOPHEN 500 MG PO TABS
ORAL_TABLET | ORAL | Status: AC
  Filled 2018-06-21: qty 2

## 2018-06-21 MED ORDER — METHOCARBAMOL 500 MG PO TABS
500.0000 mg | ORAL_TABLET | Freq: Four times a day (QID) | ORAL | Status: DC | PRN
  Administered 2018-06-21 – 2018-06-22 (×2): 500 mg via ORAL
  Filled 2018-06-21 (×2): qty 1

## 2018-06-21 MED ORDER — OXYCODONE HCL 5 MG/5ML PO SOLN: 5.0000 mg | Freq: Once | ORAL | Status: DC | PRN

## 2018-06-21 MED ORDER — GABAPENTIN 300 MG PO CAPS
300.0000 mg | ORAL_CAPSULE | Freq: Once | ORAL | Status: AC
  Administered 2018-06-21: 300 mg via ORAL

## 2018-06-21 MED ORDER — HYDROMORPHONE HCL 1 MG/ML IJ SOLN: 0.5000 mg | INTRAMUSCULAR | Status: DC | PRN

## 2018-06-21 MED ORDER — OXYCODONE HCL 5 MG PO TABS
5.0000 mg | ORAL_TABLET | ORAL | Status: DC | PRN
  Administered 2018-06-21 (×2): 10 mg via ORAL
  Administered 2018-06-22: 5 mg via ORAL
  Administered 2018-06-22: 10 mg via ORAL
  Filled 2018-06-21 (×4): qty 2

## 2018-06-21 MED ORDER — CEFAZOLIN SODIUM-DEXTROSE 2-4 GM/100ML-% IV SOLN
INTRAVENOUS | Status: AC
  Filled 2018-06-21: qty 100

## 2018-06-21 MED ORDER — METHOCARBAMOL 1000 MG/10ML IJ SOLN: 500.0000 mg | Freq: Four times a day (QID) | INTRAVENOUS | Status: DC | PRN

## 2018-06-21 MED ORDER — POVIDONE-IODINE 10 % EX SOLN: Freq: Once | CUTANEOUS | Status: DC

## 2018-06-21 MED ORDER — POLYETHYLENE GLYCOL 3350 17 G PO PACK: 17.0000 g | PACK | Freq: Every day | ORAL | Status: DC | PRN

## 2018-06-21 MED ORDER — LACTATED RINGERS IV SOLN
INTRAVENOUS | Status: DC
  Administered 2018-06-21: 10:00:00 via INTRAVENOUS

## 2018-06-21 SURGICAL SUPPLY — 147 items
ADAPTER IRRIG TUBE 2 SPIKE SOL (ADAPTER) ×3 IMPLANT
ALLOGRAFT GRFTLNK IMPLANT SYST (Anchor) ×1 IMPLANT
ANCHOR SUT FBRTK DBL 2 WIRE CL (Anchor) ×9 IMPLANT
BANDAGE ACE 4X5 VEL STRL LF (GAUZE/BANDAGES/DRESSINGS) IMPLANT
BANDAGE ACE 6X5 VEL STRL LF (GAUZE/BANDAGES/DRESSINGS) ×6 IMPLANT
BANDAGE ESMARK 6X9 LF (GAUZE/BANDAGES/DRESSINGS) ×1 IMPLANT
BENZOIN TINCTURE PRP APPL 2/3 (GAUZE/BANDAGES/DRESSINGS) ×3 IMPLANT
BIT DRILL 67X1.5XWRPS STRL (BIT) IMPLANT
BIT DRL 67X1.5XWRPS STRL (BIT)
BLADE 4.2CUDA (BLADE) IMPLANT
BLADE AVERAGE 25MMX9MM (BLADE) ×1
BLADE AVERAGE 25X9 (BLADE) ×2 IMPLANT
BLADE CUDA 5.5 (BLADE) IMPLANT
BLADE CUDA GRT WHITE 3.5 (BLADE) IMPLANT
BLADE CUTTER GATOR 3.5 (BLADE) IMPLANT
BLADE CUTTER MENIS 5.5 (BLADE) IMPLANT
BLADE GREAT WHITE 4.2 (BLADE) ×2 IMPLANT
BLADE GREAT WHITE 4.2MM (BLADE) ×1
BLADE SURG 10 STRL SS (BLADE) ×3 IMPLANT
BLADE SURG 15 STRL LF DISP TIS (BLADE) ×3 IMPLANT
BLADE SURG 15 STRL SS (BLADE) ×6
BNDG ESMARK 6X9 LF (GAUZE/BANDAGES/DRESSINGS) ×3
BONE TUNNEL PLUG CANNULATED (MISCELLANEOUS) ×3 IMPLANT
BUR EGG 3PK/BX (BURR) IMPLANT
BUR OVAL 4.0 (BURR) IMPLANT
BUR OVAL 6.0 (BURR) ×3 IMPLANT
CHLORAPREP W/TINT 26ML (MISCELLANEOUS) ×3 IMPLANT
CLOSURE STERI-STRIP 1/2X4 (GAUZE/BANDAGES/DRESSINGS) ×3
CLSR STERI-STRIP ANTIMIC 1/2X4 (GAUZE/BANDAGES/DRESSINGS) ×6 IMPLANT
COLLECTOR GRAFT TISSUE (SYSTAGENIX WOUND MANAGEMENT) ×3
COVER BACK TABLE 60X90IN (DRAPES) ×3 IMPLANT
CUFF TOURNIQUET SINGLE 34IN LL (TOURNIQUET CUFF) ×3 IMPLANT
CUTTER FLIP II 9.5MM (INSTRUMENTS) IMPLANT
CUTTER MENISCUS  4.2MM (BLADE)
CUTTER MENISCUS 4.2MM (BLADE) IMPLANT
DECANTER SPIKE VIAL GLASS SM (MISCELLANEOUS) IMPLANT
DRAPE ARTHROSCOPY W/POUCH 90 (DRAPES) ×3 IMPLANT
DRAPE IMP U-DRAPE 54X76 (DRAPES) ×3 IMPLANT
DRAPE OEC MINIVIEW 54X84 (DRAPES) ×3 IMPLANT
DRAPE U-SHAPE 47X51 STRL (DRAPES) ×3 IMPLANT
DRILL BIT WIRE PASS (BIT)
DRILL FLIPCUTTER II 10.5MM (CUTTER) IMPLANT
DRILL FLIPCUTTER II 10MM (CUTTER) IMPLANT
DRILL FLIPCUTTER II 7.0MM (INSTRUMENTS) IMPLANT
DRILL FLIPCUTTER II 7.5MM (MISCELLANEOUS) IMPLANT
DRILL FLIPCUTTER II 8.0MM (INSTRUMENTS) IMPLANT
DRILL FLIPCUTTER II 8.5MM (INSTRUMENTS) IMPLANT
DRILL FLIPCUTTER II 9.0MM (INSTRUMENTS) IMPLANT
DRILL FLIPCUTTER III 6-12 (ORTHOPEDIC DISPOSABLE SUPPLIES) ×1 IMPLANT
DRSG EMULSION OIL 3X3 NADH (GAUZE/BANDAGES/DRESSINGS) IMPLANT
DRSG PAD ABDOMINAL 8X10 ST (GAUZE/BANDAGES/DRESSINGS) ×6 IMPLANT
ELECT MENISCUS 165MM 90D (ELECTRODE) IMPLANT
ELECT REM PT RETURN 9FT ADLT (ELECTROSURGICAL) ×3
ELECTRODE REM PT RTRN 9FT ADLT (ELECTROSURGICAL) ×1 IMPLANT
FLIP CUTTER II 7.0MM (INSTRUMENTS)
FLIPCUTTER II 10.5MM (CUTTER)
FLIPCUTTER II 10MM (CUTTER)
FLIPCUTTER II 7.5MM (MISCELLANEOUS)
FLIPCUTTER II 8.0MM (INSTRUMENTS)
FLIPCUTTER II 8.5MM (INSTRUMENTS)
FLIPCUTTER II 9.0MM (INSTRUMENTS)
FLIPCUTTER III 6-12 AR-1204FF (ORTHOPEDIC DISPOSABLE SUPPLIES) ×3
GAUZE 4X4 16PLY RFD (DISPOSABLE) ×3 IMPLANT
GAUZE SPONGE 4X4 12PLY STRL (GAUZE/BANDAGES/DRESSINGS) ×6 IMPLANT
GAUZE XEROFORM 1X8 LF (GAUZE/BANDAGES/DRESSINGS) ×3 IMPLANT
GLOVE BIO SURGEON STRL SZ7.5 (GLOVE) ×6 IMPLANT
GLOVE BIOGEL PI IND STRL 7.0 (GLOVE) ×3 IMPLANT
GLOVE BIOGEL PI IND STRL 8 (GLOVE) ×3 IMPLANT
GLOVE BIOGEL PI INDICATOR 7.0 (GLOVE) ×6
GLOVE BIOGEL PI INDICATOR 8 (GLOVE) ×6
GLOVE ECLIPSE 6.5 STRL STRAW (GLOVE) ×9 IMPLANT
GLOVE ECLIPSE 8.0 STRL XLNG CF (GLOVE) ×3 IMPLANT
GLOVE ORTHO TXT STRL SZ7.5 (GLOVE) IMPLANT
GORE SMOOTHER 7.9MM (MISCELLANEOUS) IMPLANT
GORE SMOOTHER 9.5 (MISCELLANEOUS) IMPLANT
GOWN STRL REUS W/ TWL LRG LVL3 (GOWN DISPOSABLE) ×4 IMPLANT
GOWN STRL REUS W/ TWL XL LVL3 (GOWN DISPOSABLE) ×2 IMPLANT
GOWN STRL REUS W/TWL LRG LVL3 (GOWN DISPOSABLE) ×8
GOWN STRL REUS W/TWL XL LVL3 (GOWN DISPOSABLE) ×4
GRAFT ACHILLES TENDON (Bone Implant) ×3 IMPLANT
GUIDEPIN FLEX PATHFINDER 2.4MM (WIRE) ×3 IMPLANT
GUIDEPIN REAMER CUTTER 11MM (INSTRUMENTS) IMPLANT
IMMOBILIZER KNEE 22 UNIV (SOFTGOODS) ×3 IMPLANT
IMMOBILIZER KNEE 24 THIGH 36 (MISCELLANEOUS) ×2 IMPLANT
IMMOBILIZER KNEE 24 UNIV (MISCELLANEOUS) ×6
IV NS IRRIG 3000ML ARTHROMATIC (IV SOLUTION) ×12 IMPLANT
KIT BIO-TENODESIS 3X8 DISP (MISCELLANEOUS)
KIT INSRT BABSR STRL DISP BTN (MISCELLANEOUS) IMPLANT
KIT SPEAR STR 1.6MM DRILL (MISCELLANEOUS) ×3 IMPLANT
KIT TRANSTIBIAL (DISPOSABLE) IMPLANT
KNEE WRAP E Z 3 GEL PACK (MISCELLANEOUS) ×6 IMPLANT
KNIFE GRAFT ACL 10MM 5952 (MISCELLANEOUS) ×3 IMPLANT
MANIFOLD NEPTUNE II (INSTRUMENTS) ×3 IMPLANT
NDL SUT 6 .5 CRC .975X.05 MAYO (NEEDLE) ×1 IMPLANT
NEEDLE MAYO TAPER (NEEDLE) ×2
NS IRRIG 1000ML POUR BTL (IV SOLUTION) ×3 IMPLANT
PACK ARTHROSCOPY DSU (CUSTOM PROCEDURE TRAY) ×3 IMPLANT
PACK BASIN DAY SURGERY FS (CUSTOM PROCEDURE TRAY) ×3 IMPLANT
PAD CAST 4YDX4 CTTN HI CHSV (CAST SUPPLIES) ×1 IMPLANT
PADDING CAST COTTON 4X4 STRL (CAST SUPPLIES) ×2
PADDING CAST COTTON 6X4 STRL (CAST SUPPLIES) ×6 IMPLANT
PASSER SUT SWANSON 36MM LOOP (INSTRUMENTS) IMPLANT
PENCIL BUTTON HOLSTER BLD 10FT (ELECTRODE) ×3 IMPLANT
PK GRAFTLINK ALLO IMPLANT SYST (Anchor) ×3 IMPLANT
PROBE BIPOLAR ATHRO 135MM 90D (MISCELLANEOUS) ×3 IMPLANT
REAMER FLEX GUIDE PIN 10MM (DRILL) ×2 IMPLANT
REAMER/FLEX GUIDE PIN 10MM (DRILL) ×6
SCREW INTERFERENCE 7X25MM (Screw) ×3 IMPLANT
SCREW LO PRO 25MM (Screw) ×3 IMPLANT
SCREW SHEATHED INTERF 7X20 (Screw) ×3 IMPLANT
SCREW SHEATHED INTERF 9X20MM (Screw) ×3 IMPLANT
SHEET MEDIUM DRAPE 40X70 STRL (DRAPES) IMPLANT
SLEEVE SCD COMPRESS KNEE MED (MISCELLANEOUS) ×3 IMPLANT
SPONGE LAP 4X18 RFD (DISPOSABLE) ×9 IMPLANT
SUCTION FRAZIER HANDLE 10FR (MISCELLANEOUS) ×2
SUCTION TUBE FRAZIER 10FR DISP (MISCELLANEOUS) ×1 IMPLANT
SUT ETHILON 2 0 FS 18 (SUTURE) IMPLANT
SUT ETHILON 3 0 PS 1 (SUTURE) ×9 IMPLANT
SUT ETHILON 4 0 PS 2 18 (SUTURE) IMPLANT
SUT FIBERWIRE #2 38 T-5 BLUE (SUTURE) ×18
SUT FIBERWIRE 2-0 18 17.9 3/8 (SUTURE) ×3
SUT MNCRL AB 4-0 PS2 18 (SUTURE) ×3 IMPLANT
SUT MON AB 2-0 CT1 36 (SUTURE) IMPLANT
SUT PDS AB 0 CT 36 (SUTURE) IMPLANT
SUT VIC AB 0 CT1 27 (SUTURE) ×4
SUT VIC AB 0 CT1 27XBRD ANBCTR (SUTURE) ×2 IMPLANT
SUT VIC AB 0 SH 27 (SUTURE) ×3 IMPLANT
SUT VIC AB 1 CT1 27 (SUTURE)
SUT VIC AB 1 CT1 27XBRD ANBCTR (SUTURE) IMPLANT
SUT VIC AB 2-0 SH 27 (SUTURE) ×10
SUT VIC AB 2-0 SH 27XBRD (SUTURE) ×5 IMPLANT
SUT VIC AB 3-0 SH 27 (SUTURE)
SUT VIC AB 3-0 SH 27X BRD (SUTURE) IMPLANT
SUT VICRYL 4-0 PS2 18IN ABS (SUTURE) IMPLANT
SUTURE FIBERWR #2 38 T-5 BLUE (SUTURE) ×6 IMPLANT
SUTURE FIBERWR 2-0 18 17.9 3/8 (SUTURE) ×1 IMPLANT
SUTURE TAPE 1.3 FIBERLOP 20 ST (SUTURE) IMPLANT
SUTURETAPE 1.3 FIBERLOOP 20 ST (SUTURE)
SYR CONTROL 10ML LL (SYRINGE) IMPLANT
TAPE CLOTH 3X10 TAN LF (GAUZE/BANDAGES/DRESSINGS) IMPLANT
TISSUE GRAFT COLLECTOR (SYSTAGENIX WOUND MANAGEMENT) ×1 IMPLANT
TISSUE GRAFTLINK 65-95MML (Tissue) ×3 IMPLANT
TOWEL GREEN STERILE FF (TOWEL DISPOSABLE) ×9 IMPLANT
TOWEL OR NON WOVEN STRL DISP B (DISPOSABLE) IMPLANT
TUBING ARTHRO INFLOW-ONLY STRL (TUBING) ×3 IMPLANT
WASHER SPIKED 18MM (Orthopedic Implant) ×3 IMPLANT
WATER STERILE IRR 1000ML POUR (IV SOLUTION) ×3 IMPLANT

## 2018-06-21 NOTE — Anesthesia Procedure Notes (Signed)
Anesthesia Regional Block: Femoral nerve block   Pre-Anesthetic Checklist: ,, timeout performed, Correct Patient, Correct Site, Correct Laterality, Correct Procedure,, site marked, risks and benefits discussed, Surgical consent,  Pre-op evaluation,  At surgeon's request and post-op pain management  Laterality: Right  Prep: chloraprep       Needles:  Injection technique: Single-shot  Needle Type: Echogenic Stimulator Needle     Needle Length: 9cm  Needle Gauge: 21     Additional Needles:   Procedures:, nerve stimulator,,,, intact distal pulses,,,   Nerve Stimulator or Paresthesia:  Response: Quadriceps muscle contraction, 0.45 mA,   Additional Responses:   Narrative:  Start time: 06/21/2018 11:53 AM End time: 06/21/2018 12:00 PM Injection made incrementally with aspirations every 5 mL.  Performed by: Personally  Anesthesiologist: Achille Rich, MD  Additional Notes: Functioning IV was confirmed and monitors were applied.  A 90mm 21ga Arrow echogenic stimulator needle was used. Sterile prep and drape,hand hygiene and sterile gloves were used.  Negative aspiration and negative test dose prior to incremental administration of local anesthetic. The patient tolerated the procedure well.

## 2018-06-21 NOTE — Anesthesia Preprocedure Evaluation (Signed)
Anesthesia Evaluation  Patient identified by MRN, date of birth, ID band Patient awake    Reviewed: Allergy & Precautions, H&P , NPO status , Patient's Chart, lab work & pertinent test results  Airway Mallampati: II   Neck ROM: full    Dental   Pulmonary neg pulmonary ROS,    breath sounds clear to auscultation       Cardiovascular negative cardio ROS   Rhythm:regular Rate:Normal     Neuro/Psych negative neurological ROS  negative psych ROS   GI/Hepatic negative GI ROS, Neg liver ROS,   Endo/Other  negative endocrine ROS  Renal/GU negative Renal ROS     Musculoskeletal   Abdominal   Peds  Hematology   Anesthesia Other Findings   Reproductive/Obstetrics                             Anesthesia Physical Anesthesia Plan  ASA: I  Anesthesia Plan: General   Post-op Pain Management:  Regional for Post-op pain   Induction: Intravenous  PONV Risk Score and Plan: 2 and Ondansetron, Dexamethasone, Midazolam and Treatment may vary due to age or medical condition  Airway Management Planned: LMA  Additional Equipment:   Intra-op Plan:   Post-operative Plan:   Informed Consent: I have reviewed the patients History and Physical, chart, labs and discussed the procedure including the risks, benefits and alternatives for the proposed anesthesia with the patient or authorized representative who has indicated his/her understanding and acceptance.     Plan Discussed with: CRNA, Anesthesiologist and Surgeon  Anesthesia Plan Comments:         Anesthesia Quick Evaluation

## 2018-06-21 NOTE — Transfer of Care (Signed)
Immediate Anesthesia Transfer of Care Note  Patient: Jacob English  Procedure(s) Performed: KNEE ARTHROSCOPY WITH ANTERIOR CRUCIATE LIGAMENT (ACL) REPAIR WITH PATELLAR TENDON GRAFT (Right Knee) KNEE ARTHROSCOPY WITH POSTERIOR CRUCIATE LIGAMENT (PCL) REPAIR (Right Knee) KNEE ARTHROSCOPY WITH MEDIAL COLLATERAL LIGAMENT REPAIR (Right Knee)  Patient Location: PACU  Anesthesia Type:General and Regional  Level of Consciousness: awake and sedated  Airway & Oxygen Therapy: Patient Spontanous Breathing and Patient connected to face mask oxygen  Post-op Assessment: Report given to RN and Post -op Vital signs reviewed and stable  Post vital signs: Reviewed and stable  Last Vitals:  Vitals Value Taken Time  BP 108/56 06/21/2018  5:55 PM  Temp    Pulse 82 06/21/2018  5:57 PM  Resp 16 06/21/2018  5:57 PM  SpO2 100 % 06/21/2018  5:57 PM  Vitals shown include unvalidated device data.  Last Pain:  Vitals:   06/21/18 1008  TempSrc: Oral  PainSc: 0-No pain         Complications: No apparent anesthesia complications

## 2018-06-21 NOTE — Anesthesia Procedure Notes (Signed)
Procedure Name: LMA Insertion Performed by: Shamere Dilworth W, CRNA Pre-anesthesia Checklist: Patient identified, Emergency Drugs available, Suction available and Patient being monitored Patient Re-evaluated:Patient Re-evaluated prior to induction Oxygen Delivery Method: Circle system utilized Preoxygenation: Pre-oxygenation with 100% oxygen Induction Type: IV induction Ventilation: Mask ventilation without difficulty LMA: LMA inserted LMA Size: 5.0 Number of attempts: 1 Placement Confirmation: positive ETCO2 Tube secured with: Tape Dental Injury: Teeth and Oropharynx as per pre-operative assessment        

## 2018-06-21 NOTE — Progress Notes (Signed)
Assisted Dr. Hodierne with right, ultrasound guided, femoral block. Side rails up, monitors on throughout procedure. See vital signs in flow sheet. Tolerated Procedure well. 

## 2018-06-21 NOTE — Interval H&P Note (Signed)
Agree with above 

## 2018-06-21 NOTE — Discharge Instructions (Signed)
Elevate leg as much as possible and apply ice to reduce pain / swelling.  Pain: If needed, you may increase narcotic pain medication (oxycodone) for the first few days post op to 2 tablets every 4 hours.  Stop as needed pain medication as soon as you are able. Tylenol is for pain and you should take this every 8 hours while you still need pain medicine for up to 2 weeks. Take Ibuprofen up to 3 times per day to reduce pain / inflammation Take Gabapentin up to 3 times per day for pain  Diet: As you were doing prior to hospitalization   Shower: Keep the wounds dry, use an occlusive plastic wrap.  Dressing:  Keep dressings on and dry until follow up.  Activity:  Increase activity slowly as tolerated, but follow the weight bearing instructions below.  The rules on driving is that you can not be taking narcotics while you drive, and you must feel in control of the vehicle.    Weight Bearing:   Touch Down Weight Bearing.  Keep leg extended (straight) at all times.  Keep knee immobilizer at all times.  To prevent constipation: you may use a stool softener such as -  Colace (over the counter) 100 mg by mouth twice a day  Drink plenty of fluids (prune juice may be helpful) and high fiber foods Miralax (over the counter) for constipation as needed.    Itching:  If you experience itching with your medications, try taking only a single pain pill, or even half a pain pill at a time.  You can also use benadryl over the counter for itching or also to help with sleep.   Precautions:  If you experience chest pain or shortness of breath - call 911 immediately for transfer to the hospital emergency department!!  If you develop a fever greater that 101 F, purulent drainage from wound, increased redness or drainage from wound, or calf pain -- Call the office at [phone]                                                 Follow- Up Appointment:  Please call for an appointment to be seen in 1-2 weeks  [place] - [phone]   Post Anesthesia Home Care Instructions  Activity: Get plenty of rest for the remainder of the day. A responsible individual must stay with you for 24 hours following the procedure.  For the next 24 hours, DO NOT: -Drive a car -Advertising copywriter -Drink alcoholic beverages -Take any medication unless instructed by your physician -Make any legal decisions or sign important papers.  Meals: Start with liquid foods such as gelatin or soup. Progress to regular foods as tolerated. Avoid greasy, spicy, heavy foods. If nausea and/or vomiting occur, drink only clear liquids until the nausea and/or vomiting subsides. Call your physician if vomiting continues.  Special Instructions/Symptoms: Your throat may feel dry or sore from the anesthesia or the breathing tube placed in your throat during surgery. If this causes discomfort, gargle with warm salt water. The discomfort should disappear within 24 hours.  If you had a scopolamine patch placed behind your ear for the management of post- operative nausea and/or vomiting:  1. The medication in the patch is effective for 72 hours, after which it should be removed.  Wrap patch in a tissue and discard in  the trash. Wash hands thoroughly with soap and water. 2. You may remove the patch earlier than 72 hours if you experience unpleasant side effects which may include dry mouth, dizziness or visual disturbances. 3. Avoid touching the patch. Wash your hands with soap and water after contact with the patch.   Regional Anesthesia Blocks  1. Numbness or the inability to move the "blocked" extremity may last from 3-48 hours after placement. The length of time depends on the medication injected and your individual response to the medication. If the numbness is not going away after 48 hours, call your surgeon.  2. The extremity that is blocked will need to be protected until the numbness is gone and the  Strength has returned. Because  you cannot feel it, you will need to take extra care to avoid injury. Because it may be weak, you may have difficulty moving it or using it. You may not know what position it is in without looking at it while the block is in effect.  3. For blocks in the legs and feet, returning to weight bearing and walking needs to be done carefully. You will need to wait until the numbness is entirely gone and the strength has returned. You should be able to move your leg and foot normally before you try and bear weight or walk. You will need someone to be with you when you first try to ensure you do not fall and possibly risk injury.  4. Bruising and tenderness at the needle site are common side effects and will resolve in a few days.  5. Persistent numbness or new problems with movement should be communicated to the surgeon or the [facility] [phone] [facility] [phone]).

## 2018-06-22 DIAGNOSIS — S83521A Sprain of posterior cruciate ligament of right knee, initial encounter: Secondary | ICD-10-CM | POA: Diagnosis not present

## 2018-06-24 NOTE — Anesthesia Postprocedure Evaluation (Signed)
Anesthesia Post Note  Patient: Jacob English  Procedure(s) Performed: KNEE ARTHROSCOPY WITH ANTERIOR CRUCIATE LIGAMENT (ACL) REPAIR WITH PATELLAR TENDON GRAFT (Right Knee) KNEE ARTHROSCOPY WITH POSTERIOR CRUCIATE LIGAMENT (PCL) REPAIR (Right Knee) KNEE ARTHROSCOPY WITH MEDIAL COLLATERAL LIGAMENT REPAIR (Right Knee)     Patient location during evaluation: PACU Anesthesia Type: General Level of consciousness: awake and alert Pain management: pain level controlled Vital Signs Assessment: post-procedure vital signs reviewed and stable Respiratory status: spontaneous breathing, nonlabored ventilation, respiratory function stable and patient connected to nasal cannula oxygen Cardiovascular status: blood pressure returned to baseline and stable Postop Assessment: no apparent nausea or vomiting Anesthetic complications: no    Last Vitals:  Vitals:   06/22/18 0600 06/22/18 0700  BP:  (!) 149/83  Pulse:  64  Resp: 16 16  Temp:    SpO2: 100% 100%    Last Pain:  Vitals:   06/22/18 0700  TempSrc:   PainSc: 5                  Kenise Barraco S

## 2018-06-25 NOTE — Op Note (Signed)
06/21/2018  7:38 AM  PATIENT:  Jacob English    PRE-OPERATIVE DIAGNOSIS:  RIGHT KNEE ANTERIOR CRUCIATE LIGAMENT, POSTERIOR CRUCIATE LIGAMENT, MEDIAL COLLATERAL LIGAMENT TEARS  POST-OPERATIVE DIAGNOSIS:  Same  PROCEDURE:  KNEE ARTHROSCOPY WITH ANTERIOR CRUCIATE LIGAMENT (ACL) REPAIR WITH PATELLAR TENDON GRAFT, KNEE ARTHROSCOPY WITH POSTERIOR CRUCIATE LIGAMENT (PCL) REPAIR, KNEE ARTHROSCOPY WITH MEDIAL COLLATERAL LIGAMENT REPAIR  SURGEON:  Sheral Apley, MD  ASSISTANT: Aquilla Hacker, PA-C, he was present and scrubbed throughout the case, critical for completion in a timely fashion, and for retraction, instrumentation, and closure.  Ramond Marrow MD  ANESTHESIA:   gen  PREOPERATIVE INDICATIONS:  Jacob English is a  22 y.o. male with a diagnosis of RIGHT KNEE ANTERIOR CRUCIATE LIGAMENT, POSTERIOR CRUCIATE LIGAMENT, MEDIAL COLLATERAL LIGAMENT TEARS who failed conservative measures and elected for surgical management.    The risks benefits and alternatives were discussed with the patient preoperatively including but not limited to the risks of infection, bleeding, nerve injury, cardiopulmonary complications, the need for revision surgery, among others, and the patient was willing to proceed.  OPERATIVE IMPLANTS: Arthrex graftlink, linvatec interfearance screws  OPERATIVE FINDINGS: unstable knee, Lat meniscal tear  BLOOD LOSS: min  COMPLICATIONS: none  TOURNIQUET TIME: roughly 90 min with a 60 min down time  OPERATIVE PROCEDURE:  Patient was identified in the preoperative holding area and site was marked by me He was transported to the operating theater and placed on the table in supine position taking care to pad all bony prominences. After a preincinduction time out anesthesia was induced. The right extremity was prepped and draped in normal sterile fashion and a pre-incision timeout was performed. He received ancef for preoperative antibiotics.   I examined his knee under  anesthesia posterior lateral corner was stable remainder of the knee was unstable.  I started with an anterior incision over his patella tendon to perform a harvest of his BTB.  I elevated the peritenon and the central third of the patella tendon 10 mm wide was incised and then used an oscillating saw to obtain appropriately sized bone blocks from the patella taking care to not fracture the patellar penetrate the articular surface and then also the tibial tubercle.  I later collected bone graft from tunnels and placed this in the both sites and closed these with a 0 Vicryl.  Next I made a medial and lateral infrapatellar arthroscopic portals.  I began arthroscopy of the knee examined his knee ACL and PCL were both ruptured I debrided these and performed a lateral notchplasty.  He had a radial meniscus tear on the lateral side so I debrided this to a stable rim he maintained a large amount of his meniscus.  Cartilage was intact throughout.  Next I have made a posterior medial portal under direct visualization using a spinal needle initially and then the neck and blunt penetration of the posterior medial joint capsule.  Use this in a 70 degree scope to clear off the tibial footprint of the PCL protecting all neurovascular structures.  Though satisfied with this I then placed the tibial drill guide and was able to drill the tibial tunnel using a flip cutter again confirming that I protected the posterior capsule did not penetrate this.  Next I used a flip cutter to drill the femoral insertion of the PCL.  Next I turned my attention to the ACL tunnels were I drilled the tibial portion taking care to stay out of the PCL tunnel and leave room for the MCL graft.  I then drilled the femoral portion using the Pathfinder guide and was happy with the placement of all tunnels.  All grafts were then prepped for screw fixation for the ACL and suspension fixation for the PCL.  Then selected a Achilles graft for the  MCL bone block was shaped to fit into a tunnel in the femur as well as tubularized in the graft.  I then placed pins of both the femur and tibia and confirm the isometric point of the MCL and we then drilled a proximal bone tunnel in the femur and then placed this graft with a BioScrew to secure this.  Next I again confirmed our isometric point and fix it distally with a screw and washer.  I placed 2 anchors at the joint line to re-create the deep MCL and secured these to the graft as well.  Next I placed him at 90 degrees and passed the PCL and ACL graft into the knee with secure fixation again taking sure to confirm no subluxation of the knee with these fixation.  I cycled the knee tensioned all grafts and was happy with the stability of the knee at this point.  All incisions were thoroughly irrigated and closed in layers sterile dressing was applied he was awoken and taken to the PACU in stable condition    POST OPERATIVE PLAN: NWB, ASA for dvt px and mobilization

## 2018-06-25 NOTE — Discharge Summary (Signed)
Discharge Summary  Patient ID: Jacob English MRN: [id number] DOB/AGE: 07-Sep-1996 22 y.o.  Admit date: 06/21/2018 Discharge date: 06/25/2018  Admission Diagnoses:  ACL tear  Discharge Diagnoses:  Principal Problem:   ACL tear   Past Medical History:  Diagnosis Date  . Abrasion of left elbow 05/07/2017  . Metatarsal fracture 04/2017   left 4th and 5th    Surgeries: Procedure(s): KNEE ARTHROSCOPY WITH ANTERIOR CRUCIATE LIGAMENT (ACL) REPAIR WITH PATELLAR TENDON GRAFT KNEE ARTHROSCOPY WITH POSTERIOR CRUCIATE LIGAMENT (PCL) REPAIR KNEE ARTHROSCOPY WITH MEDIAL COLLATERAL LIGAMENT REPAIR on 06/21/2018   Consultants (if any):   Discharged Condition: Improved  Hospital Course: Jacob English is an 22 y.o. male who was admitted 06/21/2018 with a diagnosis of ACL tear , MCL tear, PCL tear, and went to the operating room on 06/21/2018 and underwent the above named procedures.    He was given perioperative antibiotics:  Anti-infectives (From admission, onward)   Start     Dose/Rate Route Frequency Ordered Stop   06/21/18 2300  ceFAZolin (ANCEF) IVPB 2g/100 mL premix     2 g 200 mL/hr over 30 Minutes Intravenous Every 6 hours 06/21/18 1911 06/22/18 0535   06/21/18 1000  ceFAZolin (ANCEF) IVPB 2g/100 mL premix     2 g 200 mL/hr over 30 Minutes Intravenous On call to O.R. 06/21/18 4098 06/21/18 1732    .  He was given sequential compression devices, early ambulation, and Aspirin for DVT prophylaxis.  He benefited maximally from the hospital stay and there were no complications.    Recent vital signs:  Vitals:   06/22/18 0600 06/22/18 0700  BP:  (!) 149/83  Pulse:  64  Resp: 16 16  Temp:    SpO2: 100% 100%    Recent laboratory studies:  Lab Results  Component Value Date   HGB 13.6 09/25/2016   Lab Results  Component Value Date   WBC 6.8 09/25/2016   PLT 197 09/25/2016   No results found for: INR Lab Results  Component Value Date   NA 139 09/25/2016   K 4.0  09/25/2016   CL 106 09/25/2016   CO2 27 09/25/2016   BUN 12 09/25/2016   CREATININE 1.03 09/25/2016   GLUCOSE 87 09/25/2016    Discharge Medications:   Allergies as of 06/22/2018   No Known Allergies     Medication List    TAKE these medications   acetaminophen 500 MG tablet Commonly known as:  TYLENOL Take 2 tablets (1,000 mg total) by mouth every 8 (eight) hours for 14 days. For Pain.   aspirin EC 81 MG tablet Take 1 tablet (81 mg total) by mouth daily. For DVT Prophylaxis.   docusate sodium 100 MG capsule Commonly known as:  COLACE Take 1 capsule (100 mg total) by mouth 2 (two) times daily. To prevent constipation while taking pain medication.   gabapentin 300 MG capsule Commonly known as:  NEURONTIN Take 1 capsule (300 mg total) by mouth 3 (three) times daily for 14 days. For 2 weeks post op for pain.   ibuprofen 800 MG tablet Commonly known as:  ADVIL,MOTRIN Take 1 tablet (800 mg total) by mouth every 8 (eight) hours as needed for mild pain or moderate pain.   methocarbamol 500 MG tablet Commonly known as:  ROBAXIN Take 1 tablet (500 mg total) by mouth every 6 (six) hours as needed for muscle spasms.   ondansetron 4 MG tablet Commonly known as:  ZOFRAN Take 1 tablet (4 mg total) by mouth every  8 (eight) hours as needed for nausea or vomiting.   oxyCODONE 5 MG immediate release tablet Commonly known as:  Oxy IR/ROXICODONE Take 1 tablet (5 mg total) by mouth every 4 (four) hours as needed for breakthrough pain.       Diagnostic Studies: No results found.  Disposition: Discharge disposition: 01-Home or Self Care       Discharge Instructions    Discharge patient   Complete by:  As directed    Discharge disposition:  01-Home or Self Care   Discharge patient date:  06/22/2018      Follow-up Information    Sheral Apley, MD.   Specialty:  Orthopedic Surgery Contact information: [address] [place]  [address] [phone]            Signed: Albina Billet III PA-C 06/25/2018, 7:00 AM

## 2018-07-02 ENCOUNTER — Encounter (HOSPITAL_BASED_OUTPATIENT_CLINIC_OR_DEPARTMENT_OTHER): Payer: Self-pay | Admitting: Orthopedic Surgery

## 2018-09-03 IMAGING — CT CT FOOT*L* W/O CM
4 of 5 series · 9 of 20 positions shown, 10 images · non-contrast
Comparison: None.

CLINICAL DATA: Left foot pain since an injury playing football 2
days ago. Chief while

EXAM:
CT OF THE LEFT FOOT WITHOUT CONTRAST
TECHNIQUE: Multidetector CT imaging of the left foot was performed according to
the standard protocol. Multiplanar CT image reconstructions were
also generated.

[Series 5: lower ext soft · axial · 0.56mm/px · z∈[-78,-20]mm · 2 of 70 slices shown]
[im 24/70  soft-tissue]
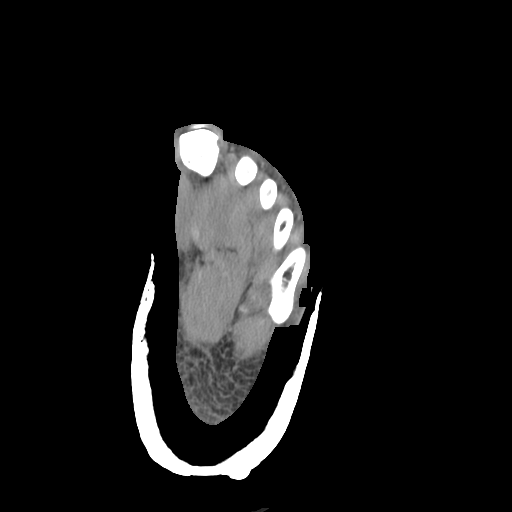
[im 47/70  soft-tissue]
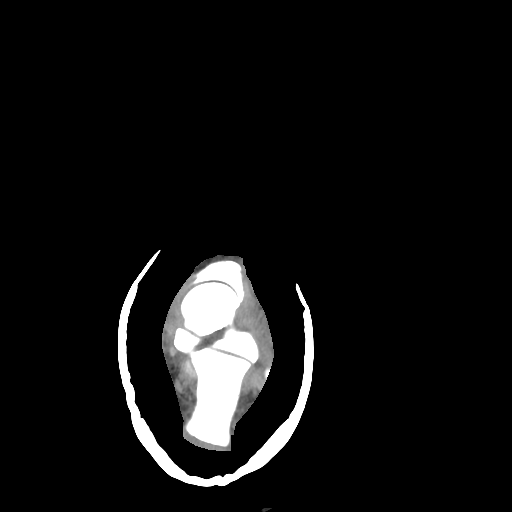

[Series 200: sag bone · sagittal · 0.56mm/px · 3 of 68 slices shown, 4 images]
[im 17/68  soft-tissue]
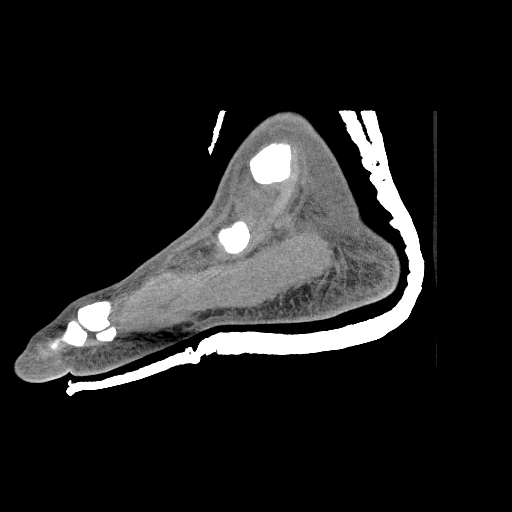
[im 17/68  bone]
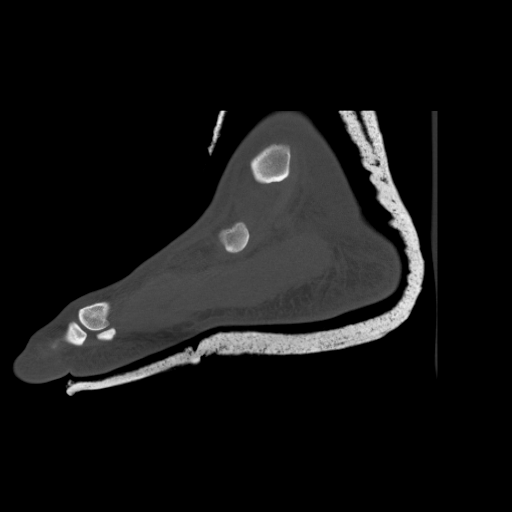
[im 34/68  bone]
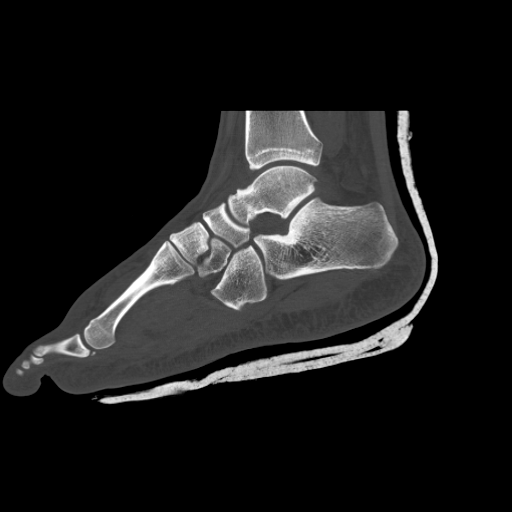
[im 51/68  bone]
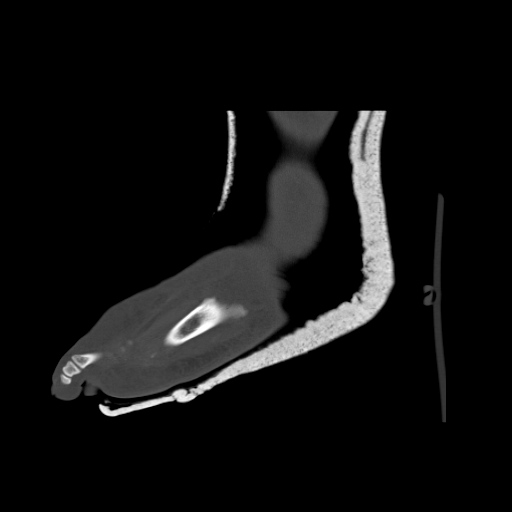

[Series 201: sag soft · sagittal · 0.56mm/px · 3 of 68 slices shown]
[im 17/68  soft-tissue]
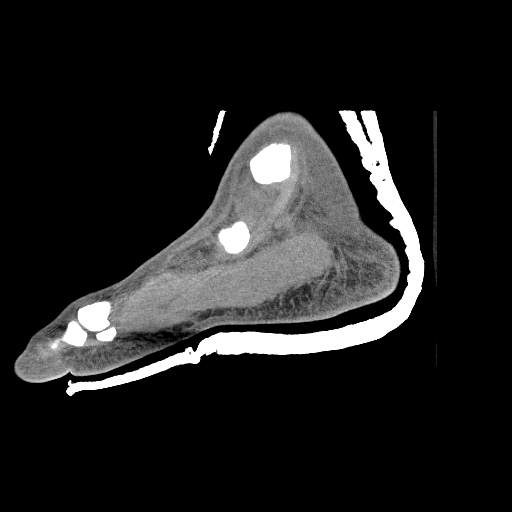
[im 34/68  soft-tissue]
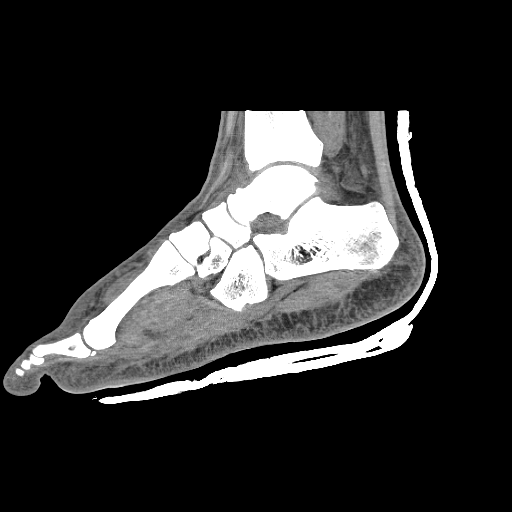
[im 51/68  soft-tissue]
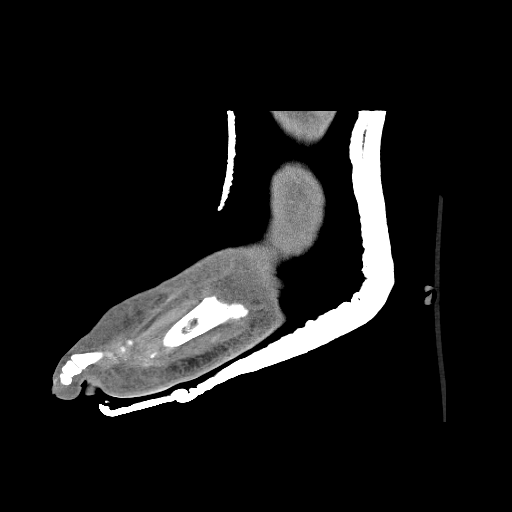

[Series 203: cor bone · coronal · 0.56mm/px · 1 of 130 slices shown]
[im 65/130  bone]
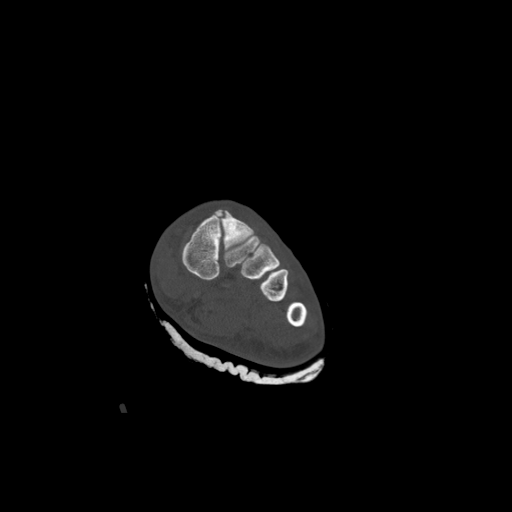

[9 of 20 positions shown; findings below may reference images not displayed]

FINDINGS: Bones/Joint/Cartilage

There is dislocation of the cuboid medially and in a plantar
direction. The dislocated cuboid slightly overrides the plantar
aspect of the base of the fourth metatarsal. There are tiny
avulsions of bone adjacent to the bases of the fourth and fifth
metatarsals. There is what appears to be old avulsion from the
posterolateral aspect of the navicular adjacent to the articulation
with the calcaneus and cuboid. The margins are sclerotic. This could
represent an accessory ossification center.

Dorsal spurring on the talus and at the dorsal midfoot.
IMPRESSION: 1. Dislocation of the cuboid with overriding at the base of the
fourth metatarsal.
2. Tiny adjacent avulsions as described.

## 2019-06-12 ENCOUNTER — Other Ambulatory Visit: Payer: Self-pay

## 2019-06-12 DIAGNOSIS — Z20822 Contact with and (suspected) exposure to covid-19: Secondary | ICD-10-CM

## 2019-06-13 LAB — NOVEL CORONAVIRUS, NAA: SARS-CoV-2, NAA: NOT DETECTED

## 2019-06-16 ENCOUNTER — Other Ambulatory Visit: Payer: Self-pay

## 2019-06-16 DIAGNOSIS — Z20822 Contact with and (suspected) exposure to covid-19: Secondary | ICD-10-CM

## 2019-06-17 LAB — NOVEL CORONAVIRUS, NAA: SARS-CoV-2, NAA: NOT DETECTED

## 2019-11-18 ENCOUNTER — Ambulatory Visit: Payer: Self-pay | Attending: Internal Medicine

## 2019-11-18 DIAGNOSIS — Z20822 Contact with and (suspected) exposure to covid-19: Secondary | ICD-10-CM | POA: Insufficient documentation

## 2019-11-19 LAB — NOVEL CORONAVIRUS, NAA: SARS-CoV-2, NAA: NOT DETECTED

## 2022-01-05 ENCOUNTER — Emergency Department (HOSPITAL_COMMUNITY)
Admission: EM | Admit: 2022-01-05 | Discharge: 2022-01-05 | Disposition: A | Discharge status: Home / Self Care | Payer: Self-pay | Attending: Emergency Medicine | Admitting: Emergency Medicine

## 2022-01-05 ENCOUNTER — Other Ambulatory Visit: Payer: Self-pay

## 2022-01-05 DIAGNOSIS — R1110 Vomiting, unspecified: Secondary | ICD-10-CM | POA: Insufficient documentation

## 2022-01-05 LAB — COMPREHENSIVE METABOLIC PANEL
ALT: 25 U/L (ref 0–44)
AST: 27 U/L (ref 15–41)
Albumin: 3.9 g/dL (ref 3.5–5.0)
Alkaline Phosphatase: 46 U/L (ref 38–126)
Anion gap: 4 — ABNORMAL LOW (ref 5–15)
BUN: 21 mg/dL — ABNORMAL HIGH (ref 6–20)
CO2: 28 mmol/L (ref 22–32)
Calcium: 8.8 mg/dL — ABNORMAL LOW (ref 8.9–10.3)
Chloride: 107 mmol/L (ref 98–111)
Creatinine, Ser: 1.32 mg/dL — ABNORMAL HIGH (ref 0.61–1.24)
GFR, Estimated: >60 mL/min (ref >60)
Glucose, Bld: 128 mg/dL — ABNORMAL HIGH (ref 70–99)
Potassium: 3.7 mmol/L (ref 3.5–5.1)
Sodium: 139 mmol/L (ref 135–145)
Total Bilirubin: 0.9 mg/dL (ref 0.3–1.2)
Total Protein: 6.7 g/dL (ref 6.5–8.1)

## 2022-01-05 LAB — CBC WITH DIFFERENTIAL/PLATELET
Abs Immature Granulocytes: 0.04 10*3/uL (ref 0.00–0.07)
Basophils Absolute: 0 10*3/uL (ref 0.0–0.1)
Basophils Relative: 0 %
Eosinophils Absolute: 0.1 10*3/uL (ref 0.0–0.5)
Eosinophils Relative: 1 %
HCT: 42.1 % (ref 39.0–52.0)
Hemoglobin: 14.1 g/dL (ref 13.0–17.0)
Immature Granulocytes: 1 %
Lymphocytes Relative: 15 %
Lymphs Abs: 0.9 10*3/uL (ref 0.7–4.0)
MCH: 27.3 pg (ref 26.0–34.0)
MCHC: 33.5 g/dL (ref 30.0–36.0)
MCV: 81.4 fL (ref 80.0–100.0)
Monocytes Absolute: 0.4 10*3/uL (ref 0.1–1.0)
Monocytes Relative: 6 %
Neutro Abs: 4.7 10*3/uL (ref 1.7–7.7)
Neutrophils Relative %: 77 %
Platelets: 185 10*3/uL (ref 150–400)
RBC: 5.17 MIL/uL (ref 4.22–5.81)
RDW: 12.3 % (ref 11.5–15.5)
WBC: 6.2 10*3/uL (ref 4.0–10.5)
nRBC: 0 % (ref 0.0–0.2)

## 2022-01-05 LAB — ETHANOL: Alcohol, Ethyl (B): <10 mg/dL (ref <10)

## 2022-01-05 MED ORDER — LACTATED RINGERS IV BOLUS
1000.0000 mL | Freq: Once | INTRAVENOUS | Status: AC
  Administered 2022-01-05: 1000 mL via INTRAVENOUS

## 2022-01-05 MED ORDER — ONDANSETRON HCL 4 MG/2ML IJ SOLN
4.0000 mg | Freq: Once | INTRAMUSCULAR | Status: AC
  Administered 2022-01-05: 4 mg via INTRAVENOUS
  Filled 2022-01-05: qty 2

## 2022-01-05 MED ORDER — ONDANSETRON HCL 8 MG PO TABS: 8.0000 mg | ORAL_TABLET | ORAL | 0 refills | Status: AC | PRN

## 2022-01-05 NOTE — ED Triage Notes (Signed)
Pt BIBA from home. Per EMS pt's partner awoke at 1230 to pt yelling and acting agitated. Pt is suspected to have had some drug ingestion and mentioned having a "gummy". EMS administered 5mg  IM versed d/t agitation. Pt unable to answer most other questions. Pt asleep upon arrival to ED. ? ?BP: 120/74 ?HR: 56 ?RR: 12 ?SPO2: 97 RA ?CBG: 144 ?

## 2022-01-05 NOTE — ED Provider Notes (Signed)
?[facility]-EMERGENCY DEPT ?Provider Note ? ? ?CSN: [id number] ?Arrival date & time: 01/05/22  0126 ? ?  ? ?History ? ?Chief Complaint  ?Patient presents with  ? Drug Overdose  ? ? ?Armand Preast is a 26 y.o. male. ? ?26 year old male who presents the ER today secondary to intoxication of some sort.  Patient has had Versed with EMS so history is obtained from them.  Apparently he took a gummy earlier in the night and then was in the bathroom throwing up and "acting crazy".  Was screaming and call for his girlfriend who is on a called EMS.  On their arrival he was in the same kind of condition and thus they put him in the ambulance and gave him 5 of IM Versed.  Patient's vital signs with them were within normal limits to include heart rate and blood pressure.  No other associated symptoms.  No other known ingestions per ? ? ? ?  ? ?Home Medications ?Prior to Admission medications   ?Medication Sig Start Date End Date Taking? Authorizing Provider  ?ondansetron (ZOFRAN) 8 MG tablet Take 1 tablet (8 mg total) by mouth every 4 (four) hours as needed for nausea. 01/05/22  Yes Marlaysia Lenig, Barbara Cower, MD  ?docusate sodium (COLACE) 100 MG capsule Take 1 capsule (100 mg total) by mouth 2 (two) times daily. To prevent constipation while taking pain medication. 06/21/18   Albina Billet III, PA-C  ?gabapentin (NEURONTIN) 300 MG capsule Take 1 capsule (300 mg total) by mouth 3 (three) times daily for 14 days. For 2 weeks post op for pain. 06/21/18 07/05/18  Albina Billet III, PA-C  ?   ? ?Allergies    ?Patient has no known allergies.   ? ?Review of Systems   ?Review of Systems ? ?Physical Exam ?Updated Vital Signs ?BP 134/75   Pulse 60   Temp 98.5 ?F (36.9 ?C) (Oral)   Resp 16   SpO2 100%  ?Physical Exam ?Vitals and nursing note reviewed.  ?Constitutional:   ?   Appearance: He is well-developed.  ?   Comments: Patient has vomitus all over his shirt and in his nose.  Will arouse to voice but goes  back to sleep quickly and is not able answer questions in a coherent manner.  ?HENT:  ?   Head: Normocephalic and atraumatic.  ?   Mouth/Throat:  ?   Mouth: Mucous membranes are moist.  ?   Pharynx: Oropharynx is clear.  ?Eyes:  ?   Pupils: Pupils are equal, round, and reactive to light.  ?Cardiovascular:  ?   Rate and Rhythm: Normal rate.  ?Pulmonary:  ?   Effort: Pulmonary effort is normal. No respiratory distress.  ?Abdominal:  ?   General: Abdomen is flat. There is no distension.  ?Musculoskeletal:     ?   General: Normal range of motion.  ?   Cervical back: Normal range of motion.  ?Skin: ?   General: Skin is warm and dry.  ?Neurological:  ?   Mental Status: He is alert.  ? ? ?ED Results / Procedures / Treatments   ?Labs ?(all labs ordered are listed, but only abnormal results are displayed) ?Labs Reviewed  ?COMPREHENSIVE METABOLIC PANEL - Abnormal; Notable for the following components:  ?    Result Value  ? Glucose, Bld 128 (*)   ? BUN 21 (*)   ? Creatinine, Ser 1.32 (*)   ? Calcium 8.8 (*)   ? Anion gap 4 (*)   ?  All other components within normal limits  ?CBC WITH DIFFERENTIAL/PLATELET  ?ETHANOL  ?RAPID URINE DRUG SCREEN, HOSP PERFORMED  ? ? ?EKG ?None ? ?Radiology ?No results found. ? ?Procedures ?Procedures  ? ? ?Medications Ordered in ED ?Medications  ?lactated ringers bolus 1,000 mL (1,000 mLs Intravenous New Bag/Given 01/05/22 0205)  ?ondansetron [facility]) injection 4 mg (4 mg Intravenous Given 01/05/22 0204)  ? ? ?ED Course/ Medical Decision Making/ A&P ?  ?                        ?Medical Decision Making ?Amount and/or Complexity of Data Reviewed ?Labs: ordered. ? ?Risk ?Prescription drug management. ? ?At this point suspect that to the fact of the benzodiazepines rather than the gummy that he took.  We will check his labs and give him some fluids while he is metabolizing.  We will also do a UDS and ethanol.  Patient does not metabolize his inappropriate timeframe will consider a CT of his  head. ? ?Patient has woken appropriately.  She tolerated p.o.  He he states that he took a gummy.  Is not sure what it was but thinks it was marijuana.  Not sure if his delta 8 or delta 9 or laced with something as he got from a friend of a friend.  Feels back to normal aside from a little bit of brain fog.  His girlfriend with him and corroborates the story.  Low suspicion for head injury at this time.  Patient stable for discharge. ? ? ?Final Clinical Impression(s) / ED Diagnoses ?Final diagnoses:  ?Vomiting, unspecified vomiting type, unspecified whether nausea present  ? ? ?Rx / DC Orders ?ED Discharge Orders   ? ?      Ordered  ?  ondansetron (ZOFRAN) 8 MG tablet  Every 4 hours PRN       ? 01/05/22 0502  ? ?  ?  ? ?  ? ? ?  ?Marily Memos, MD ?01/05/22 [phone] ? ?

## 2023-10-04 ENCOUNTER — Other Ambulatory Visit: Payer: Self-pay | Admitting: Physician Assistant

## 2023-10-04 DIAGNOSIS — M5412 Radiculopathy, cervical region: Secondary | ICD-10-CM

## 2023-10-21 ENCOUNTER — Ambulatory Visit
Admission: RE | Admit: 2023-10-21 | Discharge: 2023-10-21 | Disposition: A | Discharge status: Home / Self Care | Payer: Self-pay | Source: Ambulatory Visit | Attending: Physician Assistant | Admitting: Physician Assistant

## 2023-10-21 DIAGNOSIS — M5412 Radiculopathy, cervical region: Secondary | ICD-10-CM
